# Patient Record
Sex: Male | Born: 1970 | Race: White | Hispanic: No | Marital: Single | Smoking: Former smoker
Health system: Southern US, Community
[De-identification: ages and names within clinical notes are randomized; demographics above are authoritative.]

## PROBLEM LIST (undated history)

## (undated) DIAGNOSIS — N200 Calculus of kidney: Secondary | ICD-10-CM

## (undated) DIAGNOSIS — M459 Ankylosing spondylitis of unspecified sites in spine: Secondary | ICD-10-CM

## (undated) DIAGNOSIS — G629 Polyneuropathy, unspecified: Secondary | ICD-10-CM

## (undated) HISTORY — DX: Polyneuropathy, unspecified: G62.9

## (undated) HISTORY — DX: Calculus of kidney: N20.0

## (undated) HISTORY — DX: Ankylosing spondylitis of unspecified sites in spine: M45.9

## (undated) HISTORY — PX: ANKLE SURGERY: SHX546

---

## 2007-11-08 ENCOUNTER — Encounter: Admission: RE | Admit: 2007-11-08 | Discharge: 2007-11-08 | Payer: Self-pay | Admitting: Rheumatology

## 2014-12-30 ENCOUNTER — Other Ambulatory Visit (HOSPITAL_COMMUNITY): Payer: Self-pay | Admitting: Rheumatology

## 2014-12-30 DIAGNOSIS — R1084 Generalized abdominal pain: Secondary | ICD-10-CM

## 2015-01-08 ENCOUNTER — Ambulatory Visit (HOSPITAL_COMMUNITY): Admission: RE | Admit: 2015-01-08 | Payer: Medicare Other | Source: Ambulatory Visit

## 2015-01-14 ENCOUNTER — Ambulatory Visit (HOSPITAL_COMMUNITY)
Admission: RE | Admit: 2015-01-14 | Discharge: 2015-01-14 | Disposition: A | Discharge status: Home / Self Care | Payer: Medicare Other | Source: Ambulatory Visit | Attending: Rheumatology | Admitting: Rheumatology

## 2015-01-14 DIAGNOSIS — R1906 Epigastric swelling, mass or lump: Secondary | ICD-10-CM | POA: Diagnosis not present

## 2015-01-14 DIAGNOSIS — M4320 Fusion of spine, site unspecified: Secondary | ICD-10-CM | POA: Insufficient documentation

## 2015-01-14 DIAGNOSIS — N200 Calculus of kidney: Secondary | ICD-10-CM | POA: Insufficient documentation

## 2015-01-14 DIAGNOSIS — R1084 Generalized abdominal pain: Secondary | ICD-10-CM

## 2015-01-14 DIAGNOSIS — K5730 Diverticulosis of large intestine without perforation or abscess without bleeding: Secondary | ICD-10-CM | POA: Insufficient documentation

## 2015-02-01 ENCOUNTER — Ambulatory Visit
Admission: RE | Admit: 2015-02-01 | Discharge: 2015-02-01 | Disposition: A | Discharge status: Home / Self Care | Payer: Medicare Other | Source: Ambulatory Visit | Attending: Rheumatology | Admitting: Rheumatology

## 2015-02-01 ENCOUNTER — Other Ambulatory Visit: Payer: Self-pay | Admitting: Rheumatology

## 2015-02-01 DIAGNOSIS — M542 Cervicalgia: Secondary | ICD-10-CM

## 2016-02-23 ENCOUNTER — Ambulatory Visit (INDEPENDENT_AMBULATORY_CARE_PROVIDER_SITE_OTHER): Payer: Medicare Other | Admitting: Neurology

## 2016-02-23 ENCOUNTER — Encounter: Payer: Self-pay | Admitting: Neurology

## 2016-02-23 VITALS — BP 100/70 | HR 91 | Ht 71.5 in | Wt 214.8 lb

## 2016-02-23 DIAGNOSIS — E560 Deficiency of vitamin E: Secondary | ICD-10-CM | POA: Diagnosis not present

## 2016-02-23 DIAGNOSIS — R208 Other disturbances of skin sensation: Secondary | ICD-10-CM | POA: Diagnosis not present

## 2016-02-23 DIAGNOSIS — R413 Other amnesia: Secondary | ICD-10-CM | POA: Diagnosis not present

## 2016-02-23 DIAGNOSIS — G629 Polyneuropathy, unspecified: Secondary | ICD-10-CM | POA: Diagnosis not present

## 2016-02-23 DIAGNOSIS — R269 Unspecified abnormalities of gait and mobility: Secondary | ICD-10-CM | POA: Insufficient documentation

## 2016-02-23 DIAGNOSIS — E538 Deficiency of other specified B group vitamins: Secondary | ICD-10-CM

## 2016-02-23 DIAGNOSIS — R7302 Impaired glucose tolerance (oral): Secondary | ICD-10-CM

## 2016-02-23 DIAGNOSIS — E519 Thiamine deficiency, unspecified: Secondary | ICD-10-CM | POA: Diagnosis not present

## 2016-02-23 DIAGNOSIS — E531 Pyridoxine deficiency: Secondary | ICD-10-CM

## 2016-02-23 DIAGNOSIS — R200 Anesthesia of skin: Secondary | ICD-10-CM | POA: Insufficient documentation

## 2016-02-23 NOTE — Patient Instructions (Signed)
Remember to drink plenty of fluid, eat healthy meals and do not skip any meals. Try to eat protein with a every meal and eat a healthy snack such as fruit or nuts in between meals. Try to keep a regular sleep-wake schedule and try to exercise daily, particularly in the form of walking, 20-30 minutes a day, if you can.   As far as your medications are concerned, I would like to suggest:continue current medications  As far as diagnostic testing: MRi brain, labs  I would like to see you back for emg/ncs, sooner if we need to. Please call us with any interim questions, concerns, problems, updates or refill requests.   Our phone number is [phone]. We also have an after hours call service for urgent matters and there is a physician on-call for urgent questions. For any emergencies you know to call 911 or go to the nearest emergency room

## 2016-02-23 NOTE — Progress Notes (Signed)
[facility]    Provider:  Dr Lucia Gaskins Referring Provider: Pollyann Savoy, MD Primary Care Physician:  Doreatha Martin, MD  CC:  Sensory changes  HPI:  Benjamin Wiggins is a 45 y.o. male here as a referral from Dr. Corliss Skains for sensory changes. He has a past medical history of ankylosing spondylitis and iritis. He has tingling in the joints, trouble feeling pain in some spots moreso than others. Vision changes, blurry vision and floaters. He has to wear magnifiers. He is seeing spots and floaters. He was evaluated by optho years ago for the vision and no etiology found. The sensory changes started 6 months ago and progressive. They have recently plateaued. He doesn't feel sharp, he has fogginess. He feels dull. Memory worsening. More short-term memory loss. His hands get jittery. The sensory changes started in all the fingers with decreased dexterity. Started with numbness in the fingers. Nothing makes it better of worse, he wants to itch or rub his hands but it doesn't itch on the outside. He has trouble walking. Especially i the right foot, he had plantar fasciitis. Right foot in the heel pain and gets a shooting pain starts on the bottom of the foot and goes across the bottom of the foot right side to the left side or from lateral to medial. Happens once to twice a day or 8-10 times, brief, paroxysmal. He can feel irritation from the back of the knee all the way down to the foot. Left side is fine. Hands are symmetric. He has been on and off Enbrel for years.He was evaluated by Dr. Pearlie Oyster at [facility]. He saw them in 2005, a long ago. He was having twitches 10 years ago all over the body and workup initiated without etiology. He was exposed to chemicals and toxins in the past. He denies medications below. Enbrel is the only medication he has used for AS. 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 No FHx of neuropathy.  He has back pain that radiates down the right leg.   Reviewed the following drugs with patient and I can induce polyneuropathies. He denies any of the following except he is unclear if he has been exposed to miscellaneous toxins:  Drugs that Can Induce Polyneuropathies (patient denies) Antibiotics: Chloramphenicol Sensory, optic neuropathy, Chloroquin Sensory, Dapsone Motor, Didanosine Sensory, Ethambutol Sensorimotor, Ethionamide Sensory, Isoniazid Sensory (vitamin B6 deficiency), Metronidazole Sensory, Nitrofurantoin Sensorimotor, Savudine Sensory, Suramin Suramin Sensorimotor, Zalcitabine Sensory Chemotherapeutic: Cisplatin Sensorimotor + ototoxicity, Cytarabine Sensory, Docetaxe Sensorimotor, Paclitaxel Sensorimotor, Procarbazine Sensorimotor, Vinblastine Sensorimotor, Vincristine Sensorimotor,  Cardiovascular: Amiodarone Sensorimotor +ototoxicity, Captopril Sensorimotor Enalapril Sensorimotor, Flecainide Sensory, Hydralazine Sensory (vitamin B6 deficiency), Perhexiline Sensorimotor Rheumatologic: Allopurinol Sensorimotor,Colchicine, Sensory, Gold Sensorimotor, Indomethacin Sensorimotor Miscellaneous: (He is unclear if he has been exposed to toxins 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)): Disulfiram Sensory, Interferon alfa Sensorimotor, Lithium Sensorimotor, Lovastatin Sensorimotor, Phenytoin Sensorimotor, Pyridoxine Sensory, Simvastatin Sensorimotor, Thalidomide Sensorimotor, crylamide Sensorimotor + ataxia, Allyl chloride Sensory, Arsenic Sensorimotor, Carbon disulfide Sensorimotor, Ethylene oxide Sensorimotor + ataxia, Hexacarbons Sensorimotor, Lead _ Mercury Sensorimotor(motor > sensory), Organohosphorus esters Sensorimotor + autonomic (cholinergic), Thallium Sensorimotor, Trichloroethylene Cranial neuropathies  Reviewed notes, labs and imaging from outside physicians, which showed: Patient has a history of ankylosing spondylitis.  Reviewed notes from [facility] rheumatology. He takes Enbrel as it is available but has a difficult time due to cost of the drug. His lower back feels better since he was  in the Enbrel. He was seen on 02/03/2016 and concerned about progressively feeling sensation and warmth in his upper and lower extremities. He does not feel touch is prominent anymore. Felt as though he was losing sensation and experiencing Jeanella Crazeierce easiest. Symptoms more prominent on the right than on the left. Workup for MS in the past in [place] and he was not satisfied with workup. Recent MRI of the brain and cervical spine were reportedly normal. Patient has depression and is seeing a psychologist.   XR c-spine 01/2015 personally reviewed images and agree with the following:Vertebral body height and alignment are maintained. No fusion across the disc interspaces or facet joints is identified. Intervertebral disc space height is normal. No pathologic motion is seen with flexion or extension although range of motion is mildly limited.Facet degenerative change is seen at C7-T1.  IMPRESSION: No plain film evidence of ankylosing spondylitis of the cervical spine. Negative for pathologic motion.  Facet arthropathy C7-T1.  Review of Systems: Patient complains of symptoms per HPI as well as the following symptoms: Weight gain, blurred vision, loss of vision, shortness of breath, feeling hot, feeling cold, ringing in ears, joint pain, joint swelling, allergies, frequent infections, confusion, numbness, dizziness, restless legs, depression, decreased energy, disinterest in activities. Pertinent negatives per HPI. All others negative.   Social History   Social History  . Marital Status: Single    Spouse Name: [name]  . Number of Children: 1  . Years of Education: 14   Occupational History  . Unemployed- Disability     Social History Main Topics  . Smoking status: Former Games developermoker  . Smokeless tobacco: Not on file  . Alcohol Use: No   . Drug Use: No  . Sexual Activity: Not on file   Other Topics Concern  . Not on file   Social History Narrative   Lives with [name]    Caffeine use: 3-4 cups per day    Family History  Problem Relation Age of Onset  . Diabetes Father   . Neuropathy Neg Hx     Past Medical History  Diagnosis Date  . Ankylosing spondylitis (HCC)   . Neuropathy [facility](HCC)     Past Surgical History  Procedure Laterality Date  . Ankle surgery      titatium plate    Current Outpatient Prescriptions  Medication Sig Dispense Refill  . albuterol (PROAIR HFA) 108 (90 Base) MCG/ACT inhaler Inhale 2 puffs into the lungs every 6 (six) hours as needed.    . fluticasone (FLONASE) 50 MCG/ACT nasal spray Place 1 spray into both nostrils daily.    Marland Kitchen. gabapentin (NEURONTIN) 300 MG capsule Take 300 mg by mouth 3 (three) times daily.    Marland Kitchen. loratadine (CLARITIN) 10 MG tablet Take 10 mg by mouth daily.    . Naproxen Sodium (ALEVE PO) Take 4 tablets by mouth daily as needed.    Marland Kitchen. oxycodone (OXY-IR) 5 MG capsule Take 5 mg by mouth 2 (two) times daily as needed.    . sertraline (ZOLOFT) 50 MG tablet Take 50 mg by mouth daily.     No current facility-administered medications for this visit.    Allergies as of 02/23/2016 - Review Complete 02/23/2016  Allergen Reaction Noted  . Iohexol Shortness Of Breath 01/08/2015  . Iodinated diagnostic agents Other (See Comments) 02/23/2016    Vitals: BP 100/70 mmHg  Pulse 91  Ht 5' 11.5" (1.816 m)  Wt 214 lb 12.8 oz (97.433 kg)  BMI 29.54 kg/m2  SpO2 97%  Last Weight:  Wt Readings from Last 1 Encounters:  02/23/16 214 lb 12.8 oz (97.433 kg)   Last Height:   Ht Readings from Last 1 Encounters:  02/23/16 5' 11.5" (1.816 m)    Physical exam: Exam: Gen: NAD, conversant, well nourised, well groomed                     CV: RRR, no MRG. No Carotid Bruits. No peripheral edema, warm, nontender Eyes: Conjunctivae clear without exudates or  hemorrhage  Neuro: Detailed Neurologic Exam  Speech:    Speech is normal; fluent and spontaneous with normal comprehension.  Cognition:    The patient is oriented to person, place, and time;     recent and remote memory intact;     language fluent;     normal attention, concentration,     fund of knowledge Cranial Nerves:    The pupils are equal, round, and reactive to light. The fundi are normal and spontaneous venous pulsations are present. Visual fields are full to finger confrontation. Extraocular movements are intact. Trigeminal sensation is intact and the muscles of mastication are normal. The face is symmetric. The palate elevates in the midline. Hearing intact. Voice is normal. Shoulder shrug is normal. The tongue has normal motion without fasciculations.   Coordination:    Normal finger to nose and heel to shin. Normal rapid alternating movements.   Gait:    Heel-toe and tandem gait are normal.   Motor Observation:    No asymmetry, no atrophy, and no involuntary movements noted. Tone:    Normal muscle tone.    Posture:    Posture is normal. normal erect    Strength: Decease distally in pin prick and temperatire in the LE, unable to feel pin and temp in the uppers even above the elbows. Intact vibration and proprioception.       Strength is V/V in the upper and lower limbs.      Sensation: intact to LT     Reflex Exam:  DTR's: Absent left AJ otherwise deep tendon reflexes in the upper and lower extremities are normal bilaterally.   Toes:    The toes are downgoing bilaterally.   Clonus:    Clonus is absent.     Assessment/Plan:   45 y.o. male here as a referral from Dr. Corliss Skains for sensory changes for 6 months that started with numbness in the fingers. He has a past medical history of ankylosing spondylitis and iritis. He has tingling in the joints, trouble feeling pain in some spots, shooting pain in the right heel of the foot, back pain with radiation down  the right leg. symptoms in the fingers are symmetrical bilaterally, and his left leg is unaffected.  Right leg and right upper NCS and emg. MRI of the brain with and without contrast(patient denies recent MRI of the brain) We will perform an extensive serum neuropathy screen.  CC: Dr. Titus Dubin and Dr. Elisha Headland, MD  [facility] [address] [place], [place] [address]  Phone [phone] Fax [phone]

## 2016-02-26 ENCOUNTER — Encounter: Payer: Self-pay | Admitting: Neurology

## 2016-02-26 LAB — HEPATITIS C ANTIBODY: HCV Ab: 0.1 s/co ratio (ref 0.0–0.9)

## 2016-02-29 LAB — COMPREHENSIVE METABOLIC PANEL
ALK PHOS: 105 IU/L (ref 39–117)
ALT: 33 IU/L (ref 0–44)
AST: 16 IU/L (ref 0–40)
Albumin/Globulin Ratio: 1.6 (ref 1.2–2.2)
Albumin: 4.5 g/dL (ref 3.5–5.5)
BILIRUBIN TOTAL: 0.4 mg/dL (ref 0.0–1.2)
BUN/Creatinine Ratio: 15 (ref 9–20)
BUN: 13 mg/dL (ref 6–24)
CHLORIDE: 100 mmol/L (ref 96–106)
CO2: 20 mmol/L (ref 18–29)
Calcium: 9.5 mg/dL (ref 8.7–10.2)
Creatinine, Ser: 0.86 mg/dL (ref 0.76–1.27)
GFR calc Af Amer: 121 mL/min/{1.73_m2} (ref >59)
GFR calc non Af Amer: 105 mL/min/{1.73_m2} (ref >59)
GLUCOSE: 99 mg/dL (ref 65–99)
Globulin, Total: 2.8 g/dL (ref 1.5–4.5)
Potassium: 4.5 mmol/L (ref 3.5–5.2)
Sodium: 138 mmol/L (ref 134–144)
TOTAL PROTEIN: 7.3 g/dL (ref 6.0–8.5)

## 2016-02-29 LAB — CBC
HEMATOCRIT: 41.4 % (ref 37.5–51.0)
HEMOGLOBIN: 14.6 g/dL (ref 12.6–17.7)
MCH: 32.6 pg (ref 26.6–33.0)
MCHC: 35.3 g/dL (ref 31.5–35.7)
MCV: 92 fL (ref 79–97)
Platelets: 250 10*3/uL (ref 150–379)
RBC: 4.48 x10E6/uL (ref 4.14–5.80)
RDW: 12.9 % (ref 12.3–15.4)
WBC: 7.4 10*3/uL (ref 3.4–10.8)

## 2016-02-29 LAB — MULTIPLE MYELOMA PANEL, SERUM
ALBUMIN SERPL ELPH-MCNC: 3.7 g/dL (ref 2.9–4.4)
ALPHA 1: 0.2 g/dL (ref 0.0–0.4)
Albumin/Glob SerPl: 1.1 (ref 0.7–1.7)
Alpha2 Glob SerPl Elph-Mcnc: 0.8 g/dL (ref 0.4–1.0)
B-Globulin SerPl Elph-Mcnc: 1.2 g/dL (ref 0.7–1.3)
GAMMA GLOB SERPL ELPH-MCNC: 1.4 g/dL (ref 0.4–1.8)
Globulin, Total: 3.6 g/dL (ref 2.2–3.9)
IGG (IMMUNOGLOBIN G), SERUM: 1188 mg/dL (ref 700–1600)
IgA/Immunoglobulin A, Serum: 259 mg/dL (ref 90–386)
IgM (Immunoglobulin M), Srm: 152 mg/dL (ref 20–172)

## 2016-02-29 LAB — PAN-ANCA
Atypical pANCA: <1:20 {titer}
C-ANCA: <1:20 {titer}
P-ANCA: <1:20 {titer}

## 2016-02-29 LAB — SJOGREN'S SYNDROME ANTIBODS(SSA + SSB): ENA SSA (RO) Ab: <0.2 AI (ref 0.0–0.9)

## 2016-02-29 LAB — B12 AND FOLATE PANEL
FOLATE: 10.1 ng/mL (ref >3.0)
VITAMIN B 12: 341 pg/mL (ref 211–946)

## 2016-02-29 LAB — HEAVY METALS, BLOOD
ARSENIC: 6 ug/L (ref 2–23)
LEAD, BLOOD: 1 ug/dL (ref 0–19)
Mercury: NOT DETECTED ug/L (ref 0.0–14.9)

## 2016-02-29 LAB — HCV AB W REFLEX TO QUANT PCR: HCV Ab: 0.1 s/co ratio (ref 0.0–0.9)

## 2016-02-29 LAB — ANGIOTENSIN CONVERTING ENZYME: Angio Convert Enzyme: 41 U/L (ref 14–82)

## 2016-02-29 LAB — RPR: RPR: NONREACTIVE

## 2016-02-29 LAB — VITAMIN B1: THIAMINE: 146.4 nmol/L (ref 66.5–200.0)

## 2016-02-29 LAB — TSH: TSH: 1.93 u[IU]/mL (ref 0.450–4.500)

## 2016-02-29 LAB — SEDIMENTATION RATE: Sed Rate: 22 mm/hr — ABNORMAL HIGH (ref 0–15)

## 2016-02-29 LAB — VITAMIN B6: VITAMIN B6: 4.7 ug/L — AB (ref 5.3–46.7)

## 2016-02-29 LAB — METHYLMALONIC ACID, SERUM: METHYLMALONIC ACID: 187 nmol/L (ref 0–378)

## 2016-02-29 LAB — HIV ANTIBODY (ROUTINE TESTING W REFLEX): HIV Screen 4th Generation wRfx: NONREACTIVE

## 2016-02-29 LAB — HEMOGLOBIN A1C
ESTIMATED AVERAGE GLUCOSE: 117 mg/dL
Hgb A1c MFr Bld: 5.7 % — ABNORMAL HIGH (ref 4.8–5.6)

## 2016-02-29 LAB — B. BURGDORFI ANTIBODIES: Lyme IgG/IgM Ab: <0.91 {ISR} (ref 0.00–0.90)

## 2016-02-29 LAB — VITAMIN E: Vitamin E (Alpha Tocopherol): 13.1 mg/L (ref 5.3–17.5)

## 2016-02-29 LAB — RHEUMATOID FACTOR: Rhuematoid fact SerPl-aCnc: <10 IU/mL (ref 0.0–13.9)

## 2016-03-02 ENCOUNTER — Ambulatory Visit
Admission: RE | Admit: 2016-03-02 | Discharge: 2016-03-02 | Disposition: A | Discharge status: Home / Self Care | Payer: Medicare Other | Source: Ambulatory Visit | Attending: Neurology | Admitting: Neurology

## 2016-03-02 DIAGNOSIS — R200 Anesthesia of skin: Secondary | ICD-10-CM | POA: Diagnosis not present

## 2016-03-02 DIAGNOSIS — G629 Polyneuropathy, unspecified: Secondary | ICD-10-CM

## 2016-03-02 DIAGNOSIS — R413 Other amnesia: Secondary | ICD-10-CM

## 2016-03-02 DIAGNOSIS — R269 Unspecified abnormalities of gait and mobility: Secondary | ICD-10-CM

## 2016-03-02 MED ORDER — GADOBENATE DIMEGLUMINE 529 MG/ML IV SOLN
20.0000 mL | Freq: Once | INTRAVENOUS | Status: AC | PRN
  Administered 2016-03-02: 20 mL via INTRAVENOUS

## 2016-03-03 ENCOUNTER — Telehealth: Payer: Self-pay | Admitting: *Deleted

## 2016-03-03 ENCOUNTER — Encounter: Payer: Medicare Other | Admitting: Neurology

## 2016-03-03 NOTE — Telephone Encounter (Signed)
Called and spoke to pt about lab results per Dr Lucia GaskinsAhern note. He will start to take daily multivitamin w/ B6. Added to pt med list. He had MRI done yesterday. He stated his insurance will cover EMG/NCS study if MRI does not reveal anything. He will then proceed with that test. Advised we will call him with results once they come back. He verbalized understanding.

## 2016-03-03 NOTE — Telephone Encounter (Signed)
-----   Message from Anson FretAntonia B Ahern, MD sent at 02/28/2016  7:01 PM EDT ----- His B6 was low(normal range 5.3-46 and his B6 was 4.7), he should take a multivitamin daily with B6 in it. B6 deficiency can cause neuropathy.  Also eat things with B6 in them, Many fruits, vegetables, and whole grains are good sources of vitamin B6. Some ready-to-eat breakfast cereals are fortified with vitamin B6.  Fish, beef, and Malaw[place]urkey contain high amounts of vitamin B6. Beans and nuts are also sources of vitamin B6. If he takes a B6 supplement do not take more than 100mg  a day.  The average vitamin B6 intake is about  2 mg/day in men.  His HgbA1c was 5.7, this is technically pre-diabetes. Recommend watching diet and exercise. Otehrwise labs unremarkable. Thanks.

## 2016-03-06 ENCOUNTER — Telehealth: Payer: Self-pay | Admitting: *Deleted

## 2016-03-06 NOTE — Telephone Encounter (Signed)
Called pt about MRI results per Dr Lucia GaskinsAhern note. He verbalized understanding. He is going to call insurance company again and see if they will cover EMG/NCS now. He will call back to let us know.

## 2016-03-06 NOTE — Telephone Encounter (Signed)
-----   Message from Anson FretAntonia B Ahern, MD sent at 03/04/2016  6:01 PM EDT ----- MRI of thr brain unremarkable without any changes as compared to 2008. thanks

## 2016-03-07 ENCOUNTER — Encounter: Payer: Self-pay | Admitting: *Deleted

## 2016-03-07 NOTE — Progress Notes (Signed)
Faxed completed form by Dr Lucia GaskinsAhern back to [facility] RE: Enbrel contraindication. Fax: [phone]. Received confirmation. Sent copy to MR.

## 2016-03-21 ENCOUNTER — Telehealth: Payer: Self-pay | Admitting: Neurology

## 2016-03-21 NOTE — Telephone Encounter (Signed)
Pt called and says that [facility] is telling him they did not receive any documenttaion about pt taking Enbrel . May call pt [phone]

## 2016-03-21 NOTE — Telephone Encounter (Signed)
Called pt back. I advised we sent completed form back to [facility] on 03/07/16 and received fax confirmation. He stated he spoke to Amy on 4/7 and they had not received anything and was going to try and send another attempt. Advised I did not receive this request. I will be happy to resend completed form per his request. He verbalized understanding and appreciation.   Re-faxed completed form to [phone]. Received confirmation.

## 2016-05-17 ENCOUNTER — Other Ambulatory Visit: Payer: Self-pay | Admitting: Orthopaedic Surgery

## 2016-05-17 DIAGNOSIS — S82122B Displaced fracture of lateral condyle of left tibia, initial encounter for open fracture type I or II: Secondary | ICD-10-CM

## 2016-05-26 ENCOUNTER — Ambulatory Visit
Admission: RE | Admit: 2016-05-26 | Discharge: 2016-05-26 | Disposition: A | Discharge status: Home / Self Care | Payer: Medicare Other | Source: Ambulatory Visit | Attending: Orthopaedic Surgery | Admitting: Orthopaedic Surgery

## 2016-05-26 DIAGNOSIS — S82122B Displaced fracture of lateral condyle of left tibia, initial encounter for open fracture type I or II: Secondary | ICD-10-CM

## 2016-10-02 NOTE — Progress Notes (Signed)
This CT abdomen without contrast was already addressed.  Please see SRS for full details. Look at attached notes associated with the CT report dated FEBRUARY 20 05/24/2015  Here is some of what I wrote: also, must have a family doctor. We are unable to interpret the CT Scan. What is the family doctor's name where we can send the CT ABDOMEN report. 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 And pt has diverticulosis that needs to be seen by PCP.  We cannot give pain meds to pt. It needs to be done by PCP.

## 2016-10-12 IMAGING — CT CT KNEE*L* W/O CM
3 of 4 series · 15 of 33 positions shown, 17 images · non-contrast
Comparison: None.

CLINICAL DATA: Nonspecific (abnormal) findings on radiological and
other examination of musculoskeletal system. Left tibial plateau
fracture.

EXAM:
CT OF THE LEFT KNEE WITHOUT CONTRAST
3-DIMENSIONAL CT IMAGE RENDERING ON INDEPENDENT WORKSTATION
TECHNIQUE: Multidetector CT imaging of the left knee was performed according to
the standard protocol. Multiplanar CT image reconstructions were
also generated.
3-dimensional CT images were rendered by post-processing of the
original CT data on an independent workstation. The 3-dimensional CT
images were interpreted and findings were reported in the
accompanying complete CT report for this study

[Series 4: knee soft tissue · axial · 0.36mm/px · z∈[+470,+620]mm · 8 of 60 slices shown, 10 images]
[im 5/60  soft-tissue]
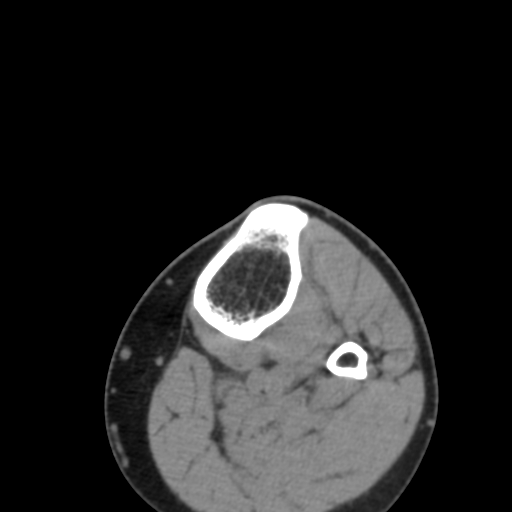
[im 5/60  bone]
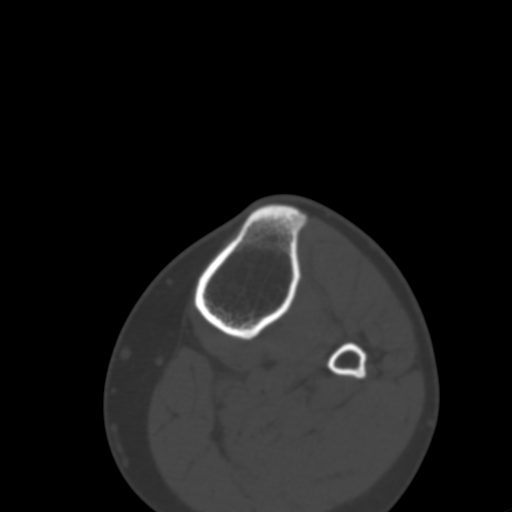
[im 14/60  bone]
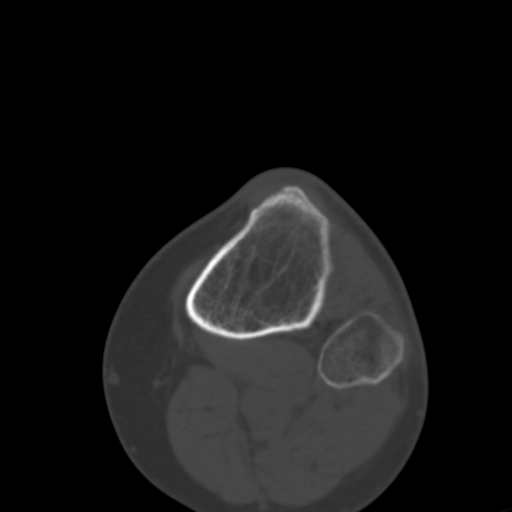
[im 19/60  bone]
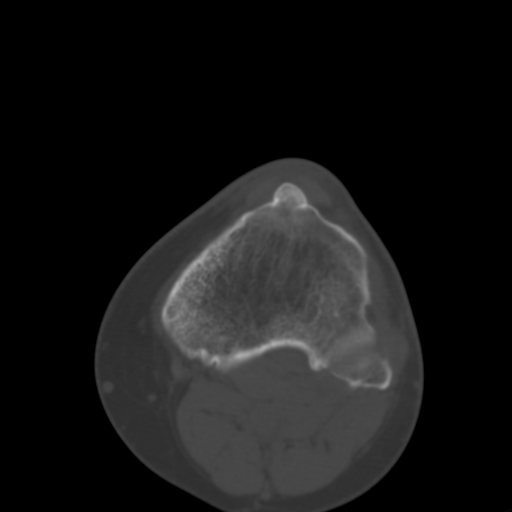
[im 28/60  bone]
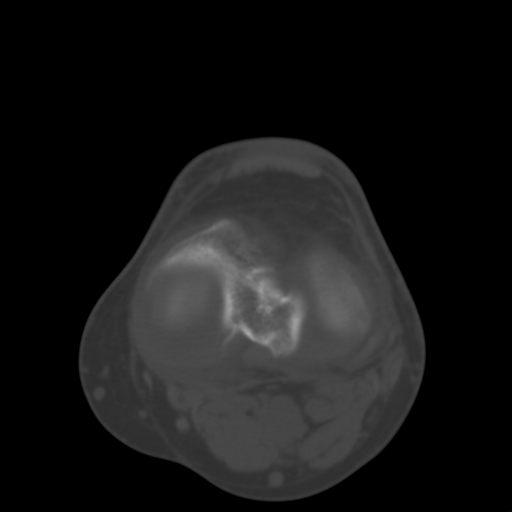
[im 32/60  soft-tissue]
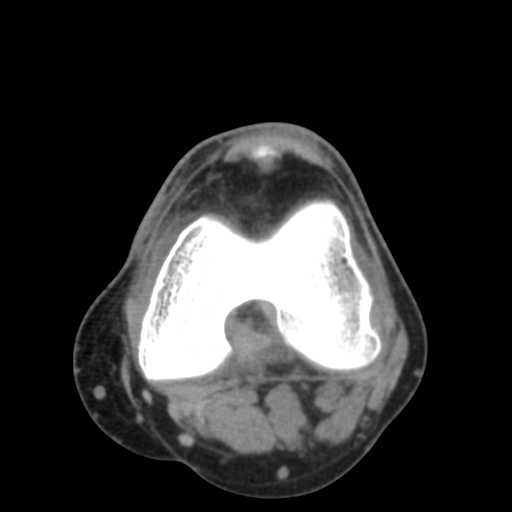
[im 32/60  bone]
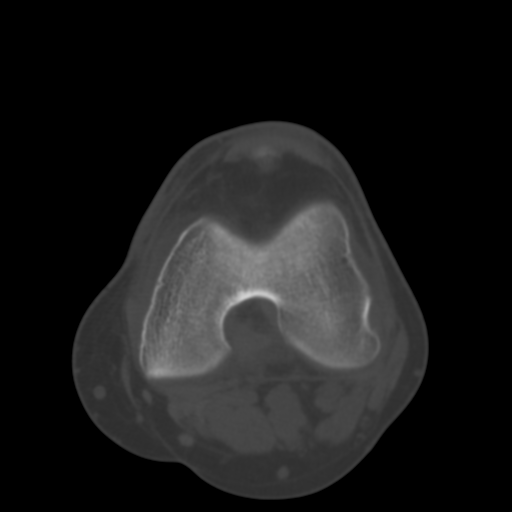
[im 41/60  bone]
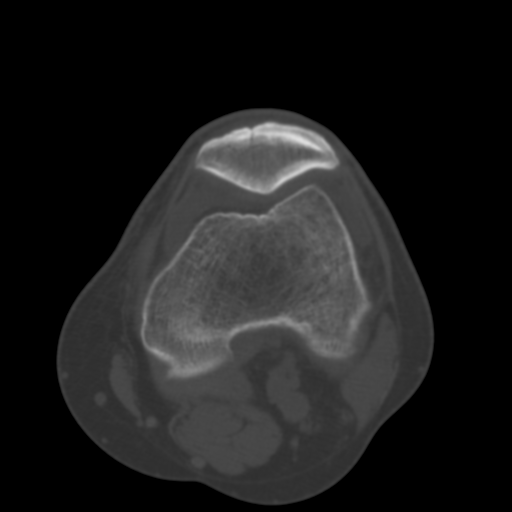
[im 46/60  bone]
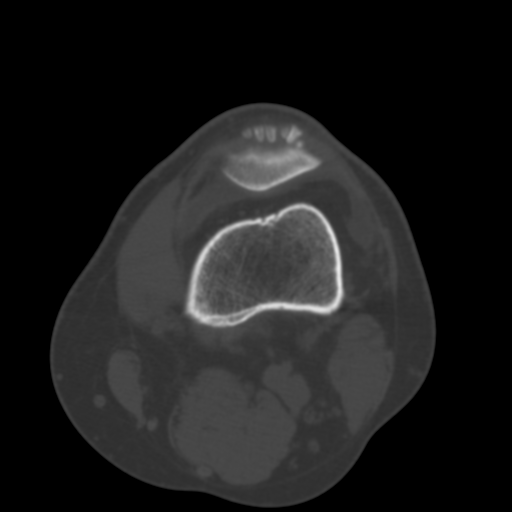
[im 55/60  bone]
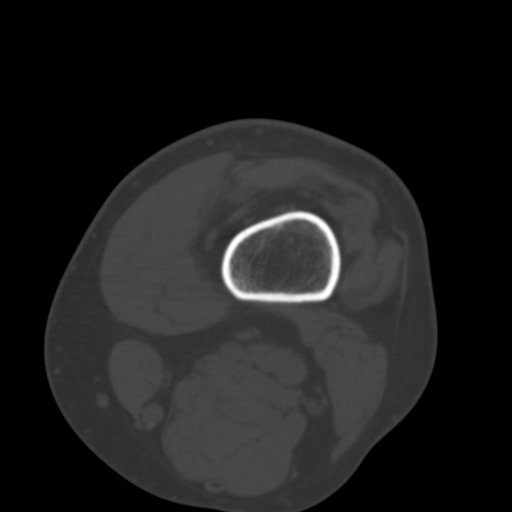

[Series 8: cor soft · coronal · 0.25mm/px · 3 of 48 slices shown]
[im 10/48  bone]
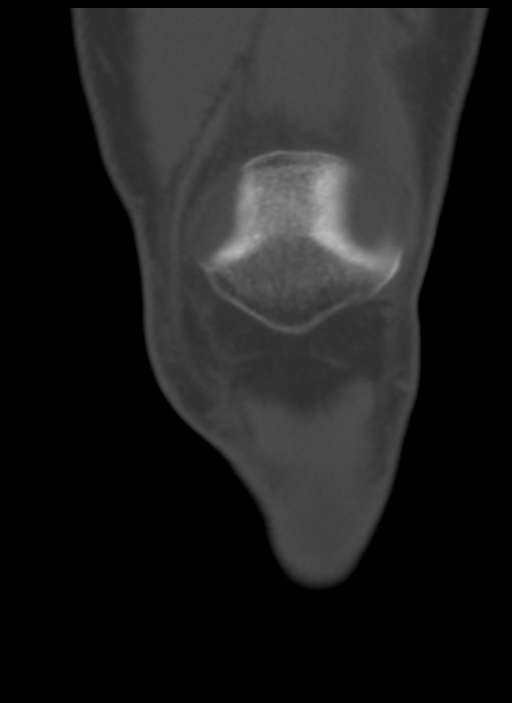
[im 19/48  bone]
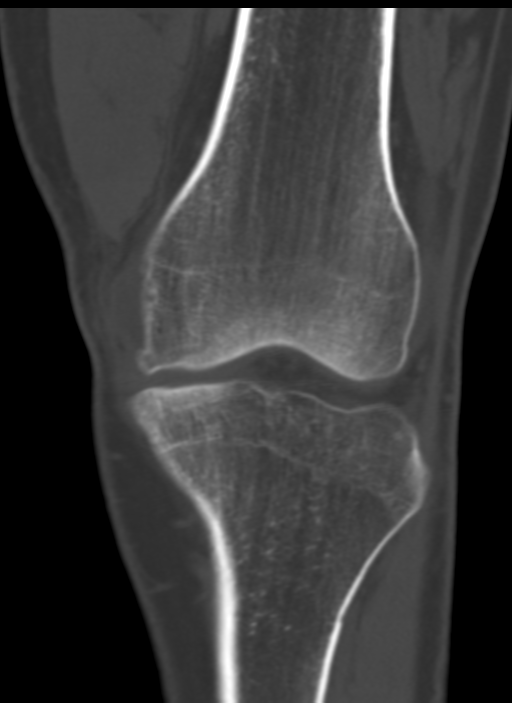
[im 29/48  bone]
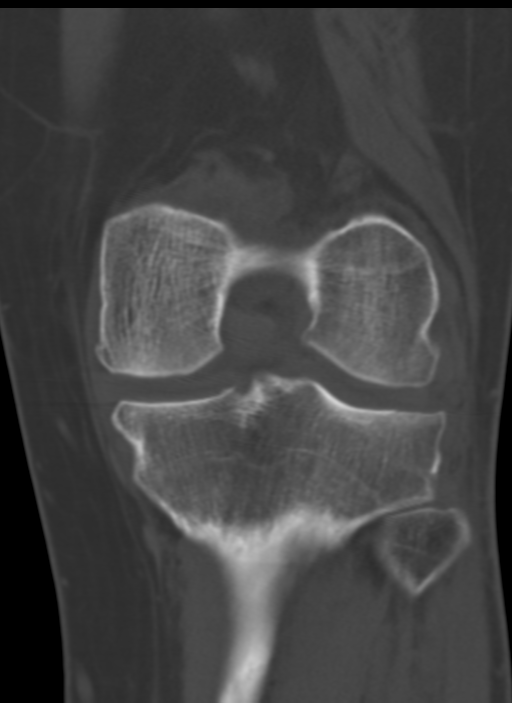

[Series 10: sag bone · sagittal · 0.29mm/px · 4 of 43 slices shown]
[im 9/43  bone]
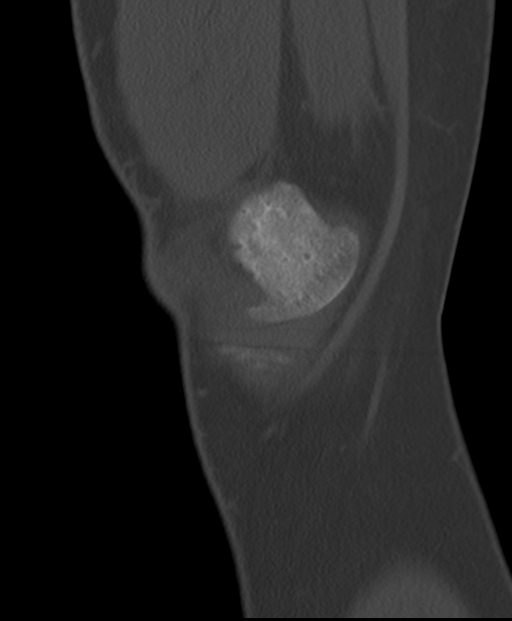
[im 17/43  bone]
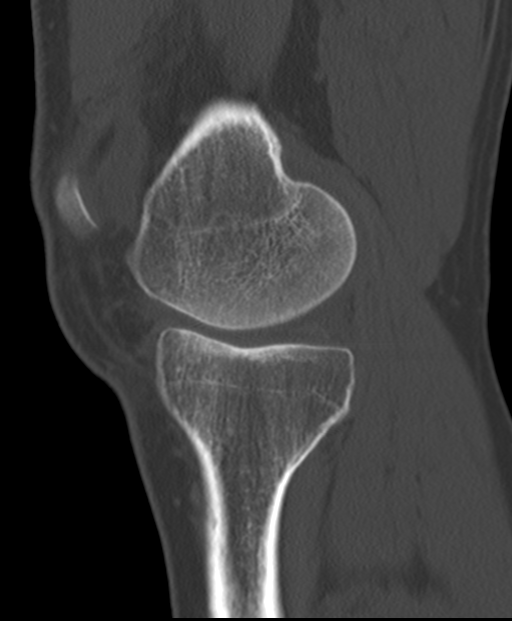
[im 26/43  bone]
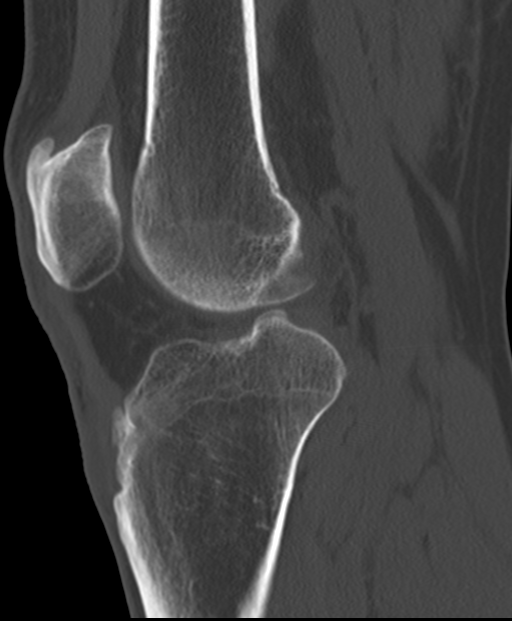
[im 34/43  bone]
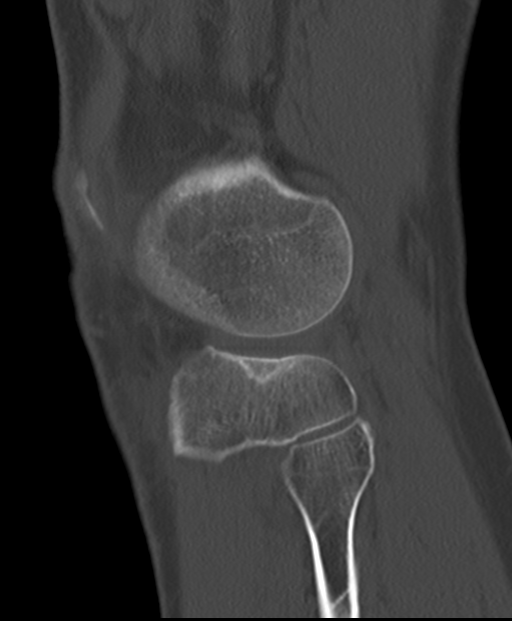

[15 of 33 positions shown; findings below may reference images not displayed]

FINDINGS: Bones/Joint/Cartilage

There is a nondisplaced mildly impacted left lateral tibial plateau
fracture involving the articular surface with approximately 1 mm of
depression. The fracture does not extend into the metaphysis.

No other fracture or dislocation. No aggressive lytic or sclerotic
osseous lesion. Minimal tricompartmental osteoarthritic changes.
Normal alignment. Small knee joint effusion.

Tendons
Intact quadriceps and patellar tendons.

Muscles

Normal.

Soft tissue
No fluid collection or hematoma.  No soft tissue mass.
IMPRESSION: 1. Nondisplaced, mildly impacted left lateral tibial plateau
fracture.

## 2016-11-03 ENCOUNTER — Telehealth: Payer: Self-pay | Admitting: Radiology

## 2016-11-03 DIAGNOSIS — Z79899 Other long term (current) drug therapy: Secondary | ICD-10-CM

## 2016-11-03 NOTE — Telephone Encounter (Signed)
No labs since July unable to refill Lab orders put in Epic  TB gold not due until July

## 2016-11-03 NOTE — Telephone Encounter (Signed)
Refill request received via fax for Enbrel, [place]

## 2016-11-07 DIAGNOSIS — Z79899 Other long term (current) drug therapy: Secondary | ICD-10-CM | POA: Insufficient documentation

## 2016-11-07 DIAGNOSIS — M459 Ankylosing spondylitis of unspecified sites in spine: Secondary | ICD-10-CM | POA: Insufficient documentation

## 2016-11-07 NOTE — Progress Notes (Signed)
Office Visit Note  Patient: Benjamin Wiggins             Date of Birth: 1971/01/11           MRN: [id number]             PCP: Doreatha MartinVELAZQUEZ,GRETCHEN, MD Referring: Cheron SchaumannVelazquez, Gretchen Y.,* Visit Date: 11/09/2016 Occupation: Disability    Subjective:  Left shoulder pain   History of Present Illness: Benjamin HarvestFrancis J Xin is a 45 y.o. male with history of ankylosing spondylitis. He states she's been taking an Brohl on regular basis since the last visit he has noticed improvement in his lower back pain. He had a fall in January where he fractured part of his knee and reports that he was in a knee brace for a while and then crutches. While using the crutches he started having pain in his left wrist and his left shoulder. His left shoulder joint is still continues to hurt and he has discomfort with range of motion. He states any activity causes increased pain to his left shoulder.  He also has noticed occasional bleeding per rectum with occasional diarrhea he had about 4 episodes and then it resolved he had recurrence one more time but now the symptoms have resolved. He is not taking any anti-inflammatories now. He had a colonoscopy about 5 years ago by Dr. Elnoria HowardHung.  Activities of Daily Living:  Patient reports morning stiffness for 1 hour.   Patient Reports nocturnal pain.  Difficulty dressing/grooming: Reports Difficulty climbing stairs: Reports Difficulty getting out of chair: Denies Difficulty using hands for taps, buttons, cutlery, and/or writing: Denies   Review of Systems  Constitutional: Positive for fatigue. Negative for night sweats and weakness ( ).  HENT: Negative for mouth sores, mouth dryness and nose dryness.   Eyes: Negative for redness and dryness.  Respiratory: Negative for shortness of breath and difficulty breathing.   Cardiovascular: Negative for chest pain, palpitations, hypertension, irregular heartbeat and swelling in legs/feet.  Gastrointestinal: Positive for blood in  stool and diarrhea. Negative for constipation.  Endocrine: Negative for increased urination.  Musculoskeletal: Positive for arthralgias, joint pain and morning stiffness. Negative for joint swelling, myalgias, muscle weakness, muscle tenderness and myalgias.  Skin: Negative for color change, rash, hair loss, nodules/bumps, skin tightness, ulcers and sensitivity to sunlight.  Allergic/Immunologic: Negative for susceptible to infections.  Neurological: Negative for dizziness, fainting, memory loss and night sweats.  Hematological: Negative for swollen glands.  Psychiatric/Behavioral: Positive for depressed mood and sleep disturbance. The patient is nervous/anxious.     PMFS History:  Patient Active Problem List   Diagnosis Date Noted  . Ankylosing spondylitis (HCC) 11/07/2016  . High risk medication use 11/07/2016  . Neuropathy (HCC) 02/23/2016  . Numbness 02/23/2016  . Facial numbness 02/23/2016  . Gait disorder 02/23/2016  . Memory changes 02/23/2016    Past Medical History:  Diagnosis Date  . Ankylosing spondylitis (HCC)   . Neuropathy (HCC)     Family History  Problem Relation Age of Onset  . Diabetes Father   . Neuropathy Neg Hx    Past Surgical History:  Procedure Laterality Date  . ANKLE SURGERY     titatium plate   Social History   Social History Narrative   Lives with Mauri Brooklynenise Smith    Caffeine use: 3-4 cups per day     Objective: Vital Signs: BP 132/70   Pulse 80   Resp 18   Ht 6' (1.829 m)   Wt 218 lb (98.9  kg)   BMI 29.57 kg/m    Physical Exam   Musculoskeletal Exam:   CDAI Exam: CDAI Homunculus Exam:   Joint Counts:  CDAI Tender Joint count: 0 CDAI Swollen Joint count: 0  Global Assessments:  Patient Global Assessment: 7 Provider Global Assessment: 5  CDAI Calculated Score: 12    Investigation: Findings:  06/25/2016 CBC CMP and TB gold normal / negative  11/12/2007 screening hepatitis panel negative     Imaging: No results  found.  Speciality Comments: No specialty comments available.    Procedures:  Large Joint Inj Date/Time: 11/09/2016 10:59 AM Performed by: Pollyann SavoyEVESHWAR, Keimani Laufer Authorized by: Pollyann SavoyEVESHWAR, Jayliana Valencia   Consent Given by:  Patient Site marked: the procedure site was marked   Timeout: prior to procedure the correct patient, procedure, and site was verified   Indications:  Pain Location:  Shoulder Site:  L glenohumeral Prep: patient was prepped and draped in usual sterile fashion   Needle Size:  27 G Needle Length:  1.5 inches Approach:  Posterior Ultrasound Guidance: No   Fluoroscopic Guidance: No   Arthrogram: No   Medications:  1 mL lidocaine 1 %; 40 mg triamcinolone acetonide 40 MG/ML Aspiration Attempted: Yes   Aspirate amount (mL):  0 Patient tolerance:  Patient tolerated the procedure well with no immediate complications   Allergies: Iohexol and Iodinated diagnostic agents   Assessment / Plan:     Visit Diagnoses: Ankylosing spondylitis of multiple sites in spine : He is been taking Enbrel on regular basis now and his lower back pain has improved. He's been having some discomfort in his left shoulder joint him since he is use crutches and having difficulty raising his arm and getting dressed. After informed consent was obtained be decided to proceed with the cortisone injection as described above.  High risk medication use - Enbrel every week -we'll check labs today and then every 3 months to monitor for drug toxicity Plan: CBC with Differential/Platelet, COMPLETE METABOLIC PANEL WITH GFR, CBC with Differential/Platelet, COMPLETE METABOLIC PANEL WITH GFR  Tendinopathy of left shoulder: He tolerates the cortisone injection well. We'll see response to that for right now.  Chronic bilateral low back pain without sciatica: Improved advised to continue with the range of motion exercises  Chronic pain of left knee handout on knee exercises was given.  Neuropathy [facility](HCC): He is on  Neurontin for that.    Orders: Orders Placed This Encounter  Procedures  . Large Joint Injection/Arthrocentesis  . CBC with Differential/Platelet  . COMPLETE METABOLIC PANEL WITH GFR  . CBC with Differential/Platelet  . COMPLETE METABOLIC PANEL WITH GFR   No orders of the defined types were placed in this encounter.   Face-to-face time spent with patient was 30 minutes. 50% of time was spent in counseling and coordination of care.  Follow-Up Instructions: Return in about 5 months (around 04/09/2017) for Ankylosing spondylitis.   Pollyann SavoyShaili Quandre Polinski, MD

## 2016-11-08 NOTE — Telephone Encounter (Signed)
Patient is coming in for an appointment tomorrow 11/09/16. Will have labs at that appointment.

## 2016-11-09 ENCOUNTER — Encounter: Payer: Self-pay | Admitting: Rheumatology

## 2016-11-09 ENCOUNTER — Ambulatory Visit (INDEPENDENT_AMBULATORY_CARE_PROVIDER_SITE_OTHER): Payer: Medicare Other | Admitting: Rheumatology

## 2016-11-09 ENCOUNTER — Ambulatory Visit: Payer: Medicare Other | Admitting: Rheumatology

## 2016-11-09 VITALS — BP 132/70 | HR 80 | Resp 18 | Ht 72.0 in | Wt 218.0 lb

## 2016-11-09 DIAGNOSIS — Z79899 Other long term (current) drug therapy: Secondary | ICD-10-CM

## 2016-11-09 DIAGNOSIS — M450 Ankylosing spondylitis of multiple sites in spine: Secondary | ICD-10-CM

## 2016-11-09 DIAGNOSIS — M545 Low back pain: Secondary | ICD-10-CM

## 2016-11-09 DIAGNOSIS — M25562 Pain in left knee: Secondary | ICD-10-CM | POA: Diagnosis not present

## 2016-11-09 DIAGNOSIS — G629 Polyneuropathy, unspecified: Secondary | ICD-10-CM

## 2016-11-09 DIAGNOSIS — M67912 Unspecified disorder of synovium and tendon, left shoulder: Secondary | ICD-10-CM

## 2016-11-09 DIAGNOSIS — G8929 Other chronic pain: Secondary | ICD-10-CM

## 2016-11-09 LAB — COMPLETE METABOLIC PANEL WITH GFR
ALT: 44 U/L (ref 9–46)
AST: 24 U/L (ref 10–40)
Albumin: 4.4 g/dL (ref 3.6–5.1)
Alkaline Phosphatase: 90 U/L (ref 40–115)
BUN: 13 mg/dL (ref 7–25)
CHLORIDE: 104 mmol/L (ref 98–110)
CO2: 24 mmol/L (ref 20–31)
Calcium: 9.4 mg/dL (ref 8.6–10.3)
Creat: 0.83 mg/dL (ref 0.60–1.35)
Glucose, Bld: 102 mg/dL — ABNORMAL HIGH (ref 65–99)
POTASSIUM: 4.3 mmol/L (ref 3.5–5.3)
SODIUM: 138 mmol/L (ref 135–146)
Total Bilirubin: 0.6 mg/dL (ref 0.2–1.2)
Total Protein: 7.2 g/dL (ref 6.1–8.1)

## 2016-11-09 LAB — CBC WITH DIFFERENTIAL/PLATELET
BASOS PCT: 0 %
Basophils Absolute: 0 cells/uL (ref 0–200)
Eosinophils Absolute: 189 cells/uL (ref 15–500)
Eosinophils Relative: 3 %
HCT: 43.5 % (ref 38.5–50.0)
Hemoglobin: 14.6 g/dL (ref 13.2–17.1)
LYMPHS PCT: 26 %
Lymphs Abs: 1638 cells/uL (ref 850–3900)
MCH: 32.2 pg (ref 27.0–33.0)
MCHC: 33.6 g/dL (ref 32.0–36.0)
MCV: 96 fL (ref 80.0–100.0)
MONOS PCT: 9 %
MPV: 9.8 fL (ref 7.5–12.5)
Monocytes Absolute: 567 cells/uL (ref 200–950)
NEUTROS ABS: 3906 {cells}/uL (ref 1500–7800)
Neutrophils Relative %: 62 %
PLATELETS: 265 10*3/uL (ref 140–400)
RBC: 4.53 MIL/uL (ref 4.20–5.80)
RDW: 12.8 % (ref 11.0–15.0)
WBC: 6.3 10*3/uL (ref 3.8–10.8)

## 2016-11-09 MED ORDER — ETANERCEPT 50 MG/ML ~~LOC~~ SOAJ: 50.0000 mg | SUBCUTANEOUS | 0 refills | Status: DC

## 2016-11-09 MED ORDER — TRIAMCINOLONE ACETONIDE 40 MG/ML IJ SUSP
40.0000 mg | INTRAMUSCULAR | Status: AC | PRN
  Administered 2016-11-09: 40 mg via INTRA_ARTICULAR

## 2016-11-09 MED ORDER — LIDOCAINE HCL 1 % IJ SOLN
1.0000 mL | INTRAMUSCULAR | Status: AC | PRN
  Administered 2016-11-09: 1 mL

## 2016-11-09 MED FILL — ENBREL 50 MG/ML SURECLICK S: 50 | 84 days supply | Qty: 12 | Fill #0

## 2016-11-09 NOTE — Progress Notes (Addendum)
Rheumatology Medication Review by a Pharmacist Does the patient feel that his/her medications are working for him/her?  Yes Has the patient been experiencing any side effects to the medications prescribed?  No Does the patient have any problems obtaining medications?  Yes  Issues to address at subsequent visits: Extra Help/[employer]   Pharmacist comments:  [name] is a pleasant [age]yo M who presents for follow up of his ankylosing spondylitis.  Patient is currently taking Enbrel 50 mg weekly.  Patient had most recent standing labs and TB Gold on 06/23/16 which were all normal.  Patient is due for standing labs today and will be due for TB Gold again in July 2018.    Patient reports some concern over cost of Enbrel.  Patient states he used to have partial Extra Help but he no longer has it.  Patient states he has tried to apply for the Enbrel patient assistance program in the past but did not qualify since he had Extra Help.  I reviewed patient's medicare plan at this time.  It appears he is in catastrophic coverage phase at this time.  Patient confirms that he has two weeks of Enbrel currently, and will need a refill soon.  Patient confirms that he can afford the medication cost while he is in catastrophic coverage phase.  Patient has more concern about copay cost for 2018.  Assisted patient in reapplying for extra help online.  Advised patient he should get a letter on the status of his extra help in 2-4 weeks.  Asked patient to call me once he knows the status.  If patient does not qualify for extra help, will assist him in applying for grants for copay assistance or the Enbrel patient assistance program for 2018.  Patient voiced understanding.  Patient denied any other medication related questions or concerns at this time.    Lilla Shookachel Reyes Aldaco, Pharm.D., BCPS Clinical Pharmacist Pager: [phone] Phone: [phone] 11/09/2016 1:08 PM

## 2016-11-09 NOTE — Addendum Note (Signed)
Addended by: Pollyann SavoyEVESHWAR, Vanissa Strength on: 11/09/2016 11:18 AM   Modules accepted: Orders

## 2016-11-09 NOTE — Patient Instructions (Addendum)
Standing Labs We placed an order today for your standing lab work.    Please come back and get your standing labs in March and every 3 months Knee Exercises Ask your health care provider which exercises are safe for you. Do exercises exactly as told by your health care provider and adjust them as directed. It is normal to feel mild stretching, pulling, tightness, or discomfort as you do these exercises, but you should stop right away if you feel sudden pain or your pain gets worse.Do not begin these exercises until told by your health care provider. STRETCHING AND RANGE OF MOTION EXERCISES  These exercises warm up your muscles and joints and improve the movement and flexibility of your knee. These exercises also help to relieve pain, numbness, and tingling. Exercise A: Knee Extension, Prone 1. Lie on your abdomen on a bed. 2. Place your left / right knee just beyond the edge of the surface so your knee is not on the bed. You can put a towel under your left / right thigh just above your knee for comfort. 3. Relax your leg muscles and allow gravity to straighten your knee. You should feel a stretch behind your left / right knee. 4. Hold this position for __________ seconds. 5. Scoot up so your knee is supported between repetitions. Repeat __________ times. Complete this stretch __________ times a day. Exercise B: Knee Flexion, Active  1. Lie on your back with both knees straight. If this causes back discomfort, bend your left / right knee so your foot is flat on the floor. 2. Slowly slide your left / right heel back toward your buttocks until you feel a gentle stretch in the front of your knee or thigh. 3. Hold this position for __________ seconds. 4. Slowly slide your left / right heel back to the starting position. Repeat __________ times. Complete this exercise __________ times a day. Exercise C: Quadriceps, Prone  1. Lie on your abdomen on a firm surface, such as a bed or padded  floor. 2. Bend your left / right knee and hold your ankle. If you cannot reach your ankle or pant leg, loop a belt around your foot and grab the belt instead. 3. Gently pull your heel toward your buttocks. Your knee should not slide out to the side. You should feel a stretch in the front of your thigh and knee. 4. Hold this position for __________ seconds. Repeat __________ times. Complete this stretch __________ times a day. Exercise D: Hamstring, Supine 1. Lie on your back. 2. Loop a belt or towel over the ball of your left / right foot. The ball of your foot is on the walking surface, right under your toes. 3. Straighten your left / right knee and slowly pull on the belt to raise your leg until you feel a gentle stretch behind your knee.  Do not let your left / right knee bend while you do this.  Keep your other leg flat on the floor. 4. Hold this position for __________ seconds. Repeat __________ times. Complete this stretch __________ times a day. STRENGTHENING EXERCISES  These exercises build strength and endurance in your knee. Endurance is the ability to use your muscles for a long time, even after they get tired. Exercise E: Quadriceps, Isometric  1. Lie on your back with your left / right leg extended and your other knee bent. Put a rolled towel or small pillow under your knee if told by your health care provider. 2. Slowly  tense the muscles in the front of your left / right thigh. You should see your kneecap slide up toward your hip or see increased dimpling just above the knee. This motion will push the back of the knee toward the floor. 3. For __________ seconds, keep the muscle as tight as you can without increasing your pain. 4. Relax the muscles slowly and completely. Repeat __________ times. Complete this exercise __________ times a day. Exercise F: Straight Leg Raises - Quadriceps 1. Lie on your back with your left / right leg extended and your other knee bent. 2. Tense the  muscles in the front of your left / right thigh. You should see your kneecap slide up or see increased dimpling just above the knee. Your thigh may even shake a bit. 3. Keep these muscles tight as you raise your leg 4-6 inches (10-15 cm) off the floor. Do not let your knee bend. 4. Hold this position for __________ seconds. 5. Keep these muscles tense as you lower your leg. 6. Relax your muscles slowly and completely after each repetition. Repeat __________ times. Complete this exercise __________ times a day. Exercise G: Hamstring, Isometric 1. Lie on your back on a firm surface. 2. Bend your left / right knee approximately __________ degrees. 3. Dig your left / right heel into the surface as if you are trying to pull it toward your buttocks. Tighten the muscles in the back of your thighs to dig as hard as you can without increasing any pain. 4. Hold this position for __________ seconds. 5. Release the tension gradually and allow your muscles to relax completely for __________ seconds after each repetition. Repeat __________ times. Complete this exercise __________ times a day. Exercise H: Hamstring Curls  If told by your health care provider, do this exercise while wearing ankle weights. Begin with __________ weights. Then increase the weight by 1 lb (0.5 kg) increments. Do not wear ankle weights that are more than __________. 1. Lie on your abdomen with your legs straight. 2. Bend your left / right knee as far as you can without feeling pain. Keep your hips flat against the floor. 3. Hold this position for __________ seconds. 4. Slowly lower your leg to the starting position. Repeat __________ times. Complete this exercise __________ times a day. Exercise I: Squats (Quadriceps) 1. Stand in front of a table, with your feet and knees pointing straight ahead. You may rest your hands on the table for balance but not for support. 2. Slowly bend your knees and lower your hips like you are going to  sit in a chair.  Keep your weight over your heels, not over your toes.  Keep your lower legs upright so they are parallel with the table legs.  Do not let your hips go lower than your knees.  Do not bend lower than told by your health care provider.  If your knee pain increases, do not bend as low. 3. Hold the squat position for __________ seconds. 4. Slowly push with your legs to return to standing. Do not use your hands to pull yourself to standing. Repeat __________ times. Complete this exercise __________ times a day. Exercise J: Wall Slides (Quadriceps)  1. Lean your back against a smooth wall or door while you walk your feet out 18-24 inches (46-61 cm) from it. 2. Place your feet hip-width apart. 3. Slowly slide down the wall or door until your knees bend __________ degrees. Keep your knees over your heels, not over your  toes. Keep your knees in line with your hips. 4. Hold for __________ seconds. Repeat __________ times. Complete this exercise __________ times a day. Exercise K: Straight Leg Raises - Hip Abductors 1. Lie on your side with your left / right leg in the top position. Lie so your head, shoulder, knee, and hip line up. You may bend your bottom knee to help you keep your balance. 2. Roll your hips slightly forward so your hips are stacked directly over each other and your left / right knee is facing forward. 3. Leading with your heel, lift your top leg 4-6 inches (10-15 cm). You should feel the muscles in your outer hip lifting.  Do not let your foot drift forward.  Do not let your knee roll toward the ceiling. 4. Hold this position for __________ seconds. 5. Slowly return your leg to the starting position. 6. Let your muscles relax completely after each repetition. Repeat __________ times. Complete this exercise __________ times a day. Exercise L: Straight Leg Raises - Hip Extensors 1. Lie on your abdomen on a firm surface. You can put a pillow under your hips if  that is more comfortable. 2. Tense the muscles in your buttocks and lift your left / right leg about 4-6 inches (10-15 cm). Keep your knee straight as you lift your leg. 3. Hold this position for __________ seconds. 4. Slowly lower your leg to the starting position. 5. Let your leg relax completely after each repetition. Repeat __________ times. Complete this exercise __________ times a day. This information is not intended to replace advice given to you by your health care provider. Make sure you discuss any questions you have with your health care provider. Document Released: 10/04/2005 Document Revised: 08/14/2016 Document Reviewed: 09/26/2015 Elsevier Interactive Patient Education  2017 [employer]   We have open lab Monday through Friday from 8:30-11:30 AM and 1-4 PM at the office of Dr. Arbutus PedShaili Nerissa Constantin/Naitik Panwala, PA.   The office is located at [address], [address], [place], [place] [id number] No appointment is necessary.   Labs are drawn by [facility].  You may receive a bill from [facility] for your lab work.

## 2016-11-10 NOTE — Progress Notes (Signed)
Labs normal.

## 2016-12-06 ENCOUNTER — Telehealth: Payer: Self-pay | Admitting: Pharmacist

## 2016-12-06 NOTE — Telephone Encounter (Signed)
Reviewed patient's medicare [place] on [email or web address].  Patient is now enrolled in Full Extra Help.  I called patient to inform him.  I advised him that with the full Extra Help benefit in 2018, an eligible beneficiary should pay no more than $3.35 for a generic drug (or a brand-name drug treated as a generic) and $8.35 for any other brand-name drug.  Patient voiced understanding and denies any other questions regarding his medications at this time.    Lilla Shookachel Rain Wil[place], Pharm.D., BCPS Clinical Pharmacist Pager: [phone] Phone: [phone] 12/06/2016 9:21 AM

## 2017-01-03 NOTE — Progress Notes (Signed)
Received a fax from [employer] regarding a prior authorization approval for ENBREL SURECLICK from 12/04/16 to 12/03/17.   Will scan document into epic.  Virat Prather, [place], CPhT   12:53 PM

## 2017-01-26 ENCOUNTER — Telehealth: Payer: Self-pay

## 2017-01-26 MED ORDER — ETANERCEPT 50 MG/ML ~~LOC~~ SOAJ: 50.0000 mg | SUBCUTANEOUS | 0 refills | Status: DC

## 2017-01-26 MED FILL — ENBREL 50 MG/ML SURECLICK S: 50 | 84 days supply | Qty: 12 | Fill #0

## 2017-01-26 NOTE — Telephone Encounter (Signed)
Noted patient's last refill of Enbrel was on 11/09/16 at [facility] for a 90 day supply. I called patient with a refill reminder call.  Patient confirms he does need a refill at this time. He will need a new Rx sent to [facility]            (phone: [phone]) because he is out of refills.  Benjamin Wiggins, [place], CPhT

## 2017-01-26 NOTE — Telephone Encounter (Signed)
Prescription sent to the pharmacy.

## 2017-01-26 NOTE — Telephone Encounter (Signed)
ok 

## 2017-01-26 NOTE — Telephone Encounter (Signed)
Last Visit: 11/09/16 Next Visit: 04/09/17 Labs: 11/09/16 TB Gold: 06/23/16 Neg  Okay to refill Enbrel?

## 2017-02-23 ENCOUNTER — Telehealth: Payer: Self-pay

## 2017-02-23 NOTE — Telephone Encounter (Signed)
Last Visit: 11/09/16 Next Visit: 04/09/17 Labs: 11/09/16 TB Gold: 06/23/16 Neg Patient will update labs at the beginning of next week.   Okay to refill Enbrel?

## 2017-02-23 NOTE — Telephone Encounter (Signed)
Noted patient's last refill of Enbrel was on 01/26/17 at [facility].  I called patient with a refill reminder call.  Patient confirms he does need a refill at this time.  He will use his last pen on Sunday, 02/25/17.  He is out of refills at this time. His next appointment is Apr 09, 2017. Can we send a new Rx to [facility]? Thanks

## 2017-02-23 NOTE — Telephone Encounter (Signed)
ok 

## 2017-04-03 ENCOUNTER — Telehealth: Payer: Self-pay | Admitting: Pharmacist

## 2017-04-03 NOTE — Telephone Encounter (Signed)
I called patient to follow up on his Enbrel.  Noted patient was due for labs in March but did not go get labs.  I called patient to follow up.  There was no answer and voicemail was full.  Will attempt to call patient again tomorrow.

## 2017-04-05 ENCOUNTER — Encounter: Payer: Self-pay | Admitting: Pharmacist

## 2017-04-05 NOTE — Telephone Encounter (Signed)
Third attempt to reach patient regarding labs and follow up visit.  I left another message asking the patient to call the office.  A letter was also mailed to him informing him he is due for labs/follow up visit.     Lilla Shookachel Aryia Delira, Pharm.D., BCPS, CPP Clinical Pharmacist Pager: [phone] Phone: [phone] 04/05/2017 1:55 PM

## 2017-04-09 ENCOUNTER — Ambulatory Visit: Payer: Medicare Other | Admitting: Rheumatology

## 2017-04-13 ENCOUNTER — Telehealth: Payer: Self-pay

## 2017-04-13 NOTE — Telephone Encounter (Signed)
Noted patient's last refill of Enbrel was on 01/26/17 (90 day supply) at [facility].  I called patient with a refill reminder call. Patient's mailbox if full, could not leave a message.   Ellakate Gonsalves, [place], CPhT 8:59 AM

## 2017-04-17 ENCOUNTER — Ambulatory Visit: Payer: Medicare Other | Admitting: Rheumatology

## 2017-04-18 ENCOUNTER — Other Ambulatory Visit: Payer: Self-pay | Admitting: Rheumatology

## 2017-04-20 NOTE — Telephone Encounter (Signed)
Last Visit: 11/09/16 Next Visit due May 2018. Message sent to the front to schedule patient.   Per Dr. Corliss Skainseveshwar okay to refill 30 day supply

## 2017-05-16 ENCOUNTER — Telehealth: Payer: Self-pay | Admitting: Pharmacist

## 2017-05-16 ENCOUNTER — Other Ambulatory Visit: Payer: Self-pay

## 2017-05-16 DIAGNOSIS — Z79899 Other long term (current) drug therapy: Secondary | ICD-10-CM

## 2017-05-16 LAB — CBC WITH DIFFERENTIAL/PLATELET
BASOS PCT: 1 %
Basophils Absolute: 53 cells/uL (ref 0–200)
EOS PCT: 2 %
Eosinophils Absolute: 106 cells/uL (ref 15–500)
HCT: 44 % (ref 38.5–50.0)
Hemoglobin: 15 g/dL (ref 13.2–17.1)
Lymphocytes Relative: 40 %
Lymphs Abs: 2120 cells/uL (ref 850–3900)
MCH: 33 pg (ref 27.0–33.0)
MCHC: 34.1 g/dL (ref 32.0–36.0)
MCV: 96.9 fL (ref 80.0–100.0)
MONOS PCT: 8 %
MPV: 9.6 fL (ref 7.5–12.5)
Monocytes Absolute: 424 cells/uL (ref 200–950)
NEUTROS PCT: 49 %
Neutro Abs: 2597 cells/uL (ref 1500–7800)
PLATELETS: 220 10*3/uL (ref 140–400)
RBC: 4.54 MIL/uL (ref 4.20–5.80)
RDW: 12.7 % (ref 11.0–15.0)
WBC: 5.3 10*3/uL (ref 3.8–10.8)

## 2017-05-16 LAB — COMPLETE METABOLIC PANEL WITH GFR
ALT: 46 U/L (ref 9–46)
AST: 23 U/L (ref 10–40)
Albumin: 4.2 g/dL (ref 3.6–5.1)
Alkaline Phosphatase: 86 U/L (ref 40–115)
BUN: 13 mg/dL (ref 7–25)
CALCIUM: 9.1 mg/dL (ref 8.6–10.3)
CHLORIDE: 102 mmol/L (ref 98–110)
CO2: 23 mmol/L (ref 20–31)
Creat: 0.82 mg/dL (ref 0.60–1.35)
GFR, Est African American: >89 mL/min (ref >=60)
GFR, Est Non African American: >89 mL/min (ref >=60)
GLUCOSE: 97 mg/dL (ref 65–99)
POTASSIUM: 4.3 mmol/L (ref 3.5–5.3)
SODIUM: 135 mmol/L (ref 135–146)
Total Bilirubin: 0.6 mg/dL (ref 0.2–1.2)
Total Protein: 6.9 g/dL (ref 6.1–8.1)

## 2017-05-16 MED ORDER — ETANERCEPT 50 MG/ML ~~LOC~~ SOAJ: 50.0000 mg | SUBCUTANEOUS | 0 refills | Status: DC

## 2017-05-16 MED FILL — ENBREL 50 MG/ML SURECLICK S: 50 | 84 days supply | Qty: 12 | Fill #0

## 2017-05-16 NOTE — Telephone Encounter (Signed)
Patient came in for labs today.  He is in need of Enbrel refill.    Last visit: 11/09/16 Next visit: 05/17/17 Labs: 11/09/16 normal; CBC, CMP drawn today 06/23/16 TB negative.  Patient is aware he will be due for TB Gold in July.   Okay to refill Enbrel?

## 2017-05-16 NOTE — Telephone Encounter (Signed)
ok 

## 2017-05-17 ENCOUNTER — Encounter: Payer: Self-pay | Admitting: Rheumatology

## 2017-05-17 ENCOUNTER — Ambulatory Visit (INDEPENDENT_AMBULATORY_CARE_PROVIDER_SITE_OTHER): Payer: Medicare Other | Admitting: Rheumatology

## 2017-05-17 VITALS — BP 101/67 | HR 76 | Resp 14 | Ht 72.0 in | Wt 218.0 lb

## 2017-05-17 DIAGNOSIS — M7522 Bicipital tendinitis, left shoulder: Secondary | ICD-10-CM | POA: Diagnosis not present

## 2017-05-17 DIAGNOSIS — M450 Ankylosing spondylitis of multiple sites in spine: Secondary | ICD-10-CM

## 2017-05-17 DIAGNOSIS — Z79899 Other long term (current) drug therapy: Secondary | ICD-10-CM

## 2017-05-17 MED ORDER — TRIAMCINOLONE ACETONIDE 40 MG/ML IJ SUSP
20.0000 mg | INTRAMUSCULAR | Status: AC | PRN
  Administered 2017-05-17: 20 mg via INTRA_ARTICULAR

## 2017-05-17 MED ORDER — LIDOCAINE HCL (PF) 1 % IJ SOLN
0.5000 mL | INTRAMUSCULAR | Status: AC | PRN
  Administered 2017-05-17: .5 mL

## 2017-05-17 NOTE — Patient Instructions (Signed)
Biceps Tendon Tendinitis (Distal) Distal biceps tendon tendinitis is inflammation of the distal biceps tendon. The distal biceps tendon is a strong cord of tissue that connects the biceps muscle, on the front of the upper arm, to a bone (radius) in the elbow. Distal biceps tendon tendinitis can interfere with the ability to bend the elbow and turn the hand palm-up (supination). This condition is usually caused by overusing the elbow joint and the biceps muscle, and it usually heals within 6 weeks. Distal biceps tendon tendinitis may include a grade 1 or grade 2 strain of the tendon. A grade 1 strain is mild, and it involves a slight pull of the tendon without any stretching or noticeable tearing of the tendon. There is usually no loss of biceps muscle strength. A grade 2 strain is moderate, and it involves a small tear in the tendon. The tendon is stretched, and biceps muscle strength is usually decreased. What are the causes? This condition may be caused by:  A sudden increase in the frequency or intensity of activity that involves the elbow and the biceps muscle.  Overuse of the biceps muscle. This can happen when you do the same movements over and over, such as: ? Supination. ? Forceful straightening (hyperextension) of the elbow. ? Bending of the elbow.  A direct, forceful hit or injury (trauma) to the elbow. This is rare.  What increases the risk? The following factors may make you more likely to develop this condition:  Playing contact sports.  Playing sports that involve throwing and overhead movements, including racket sports, gymnastics, weight lifting, or bodybuilding.  Doing physical labor.  Having poor strength and flexibility of the arm and shoulder.  Having injured other parts of the elbow.  What are the signs or symptoms? Symptoms of this condition may include:  Pain and inflammation in the front of the elbow. Pain may get worse during certain movements, such  as: ? Supination. ? Bending the elbow. ? Lifting or carrying objects. ? Throwing.  A feeling of warmth in the front of the elbow.  A crackling sound (crepitation) when you move or touch the elbow or the upper arm.  In some cases, symptoms may return (recur) after treatment, and they may be long-lasting (chronic). How is this diagnosed? This condition is diagnosed based on your symptoms, your medical history, and a physical exam. You may have tests, including X-rays or MRIs. Your health care provider may test your range of motion by having you do arm movements. How is this treated? This condition is treated by resting and icing the injured area, and by doing physical therapy exercises. Depending on the severity of your condition, treatment may also include:  Medicines to help relieve pain and inflammation.  Ultrasound therapy. This is the application of sound waves to the injured area.  Follow these instructions at home: Managing pain, stiffness, and swelling  If directed, put ice on the injured area: ? Put ice in a plastic bag. ? Place a towel between your skin and the bag. ? Leave the ice on for 20 minutes, 2-3 times a day.  Move your fingers often to avoid stiffness and to lessen swelling.  Raise (elevate) the injured area above the level of your heart while you are sitting or lying down.  If directed, apply heat to the affected area before you exercise. Use the heat source that your health care provider recommends, such as a moist heat pack or a heating pad. ? Place a towel between   your skin and the heat source. ? Leave the heat on for 20-30 minutes. ? Remove the heat if your skin turns bright red. This is especially important if you are unable to feel pain, heat, or cold. You may have a greater risk of getting burned. Activity  Return to your normal activities as told by your health care provider. Ask your health care provider what activities are safe for you.  Do not lift  anything that is heavier than 10 lb (4.5 kg) until your health care provider tells you that it is safe.  Avoid activities that cause pain or make your condition worse.  Do exercises as told by your health care provider. General instructions  Take over-the-counter and prescription medicines only as told by your health care provider.  Do not drive or operate heavy machinery while taking prescription pain medicines.  Keep all follow-up visits as told by your health care provider. This is important. How is this prevented?  Warm up and stretch before being active.  Cool down and stretch after being active.  Give your body time to rest between periods of activity.  Make sure to use equipment that fits you.  Be safe and responsible while being active to avoid falls.  Do at least 150 minutes of moderate-intensity exercise each week, such as brisk walking or water aerobics.  Maintain physical fitness, including: ? Strength. ? Flexibility. ? Cardiovascular fitness. ? Endurance. Contact a health care provider if:  You have symptoms that get worse or do not get better after 2 weeks of treatment.  You develop new symptoms. Get help right away if:  You develop severe pain. This information is not intended to replace advice given to you by your health care provider. Make sure you discuss any questions you have with your health care provider. Document Released: 11/20/2005 Document Revised: 07/27/2016 Document Reviewed: 10/29/2015 Elsevier Interactive Patient Education  2018 [employer] Inc.  

## 2017-05-17 NOTE — Progress Notes (Signed)
Office Visit Note  Patient: Benjamin Wiggins             Date of Birth: 13-Feb-1971           MRN: [id number]             PCP: Loraine Leriche., MD Referring: Loraine Leriche.,* Visit Date: 05/17/2017 Occupation: _0 @    Subjective:  Pain in left shoulder   History of Present Illness: Benjamin Wiggins is a 46 y.o. male    Ongoing left shoulder joint pain. Injection to left shoulder helped temporarily and then pain returned. Has to use right arm to lift left arm at times.  Will see dr. Durward Fortes for eval and treatment after getting another cortisone injection today.  Ankylosising Spondyl doing well. Some back and si joint pain but no change from past visit.  Stopped zoloft due to calf pain.  Plans to see psych doctor to discuss next medication / treatment option.  Activities of Daily Living:  Patient reports morning stiffness for 15 minutes.   Patient Reports nocturnal pain.  Difficulty dressing/grooming: Reports Difficulty climbing stairs: Reports Difficulty getting out of chair: Reports Difficulty using hands for taps, buttons, cutlery, and/or writing: Denies   Review of Systems  Constitutional: Negative for fatigue.  HENT: Negative for mouth sores and mouth dryness.   Eyes: Negative for dryness.  Respiratory: Negative for shortness of breath.   Gastrointestinal: Negative for constipation and diarrhea.  Musculoskeletal: Negative for myalgias and myalgias.  Skin: Negative for sensitivity to sunlight.  Neurological: Negative for memory loss.  Psychiatric/Behavioral: Negative for sleep disturbance.    PMFS History:  Patient Active Problem List   Diagnosis Date Noted  . Ankylosing spondylitis ([place]) 11/07/2016  . High risk medication use 11/07/2016  . Neuropathy 02/23/2016  . Numbness 02/23/2016  . Facial numbness 02/23/2016  . Gait disorder 02/23/2016  . Memory changes 02/23/2016    Past Medical History:  Diagnosis Date  . Ankylosing  spondylitis ([place])   . Neuropathy     Family History  Problem Relation Age of Onset  . Diabetes Father   . Neuropathy Neg Hx    Past Surgical History:  Procedure Laterality Date  . ANKLE SURGERY     titatium plate   Social History   Social History Narrative   Lives with [name]    Caffeine use: 3-4 cups per day     Objective: Vital Signs: BP 101/67   Pulse 76   Resp 14   Ht 6' (1.829 m)   Wt 218 lb (98.9 kg)   BMI 29.57 kg/m    Physical Exam  Constitutional: He is oriented to person, place, and time. He appears well-developed and well-nourished.  HENT:  Head: Normocephalic and atraumatic.  Eyes: Conjunctivae and EOM are normal. Pupils are equal, round, and reactive to light.  Neck: Normal range of motion. Neck supple.  Cardiovascular: Normal rate, regular rhythm and normal heart sounds.  Exam reveals no gallop and no friction rub.   No murmur heard. Pulmonary/Chest: Effort normal and breath sounds normal. No respiratory distress. He has no wheezes. He has no rales. He exhibits no tenderness.  Abdominal: Soft. He exhibits no distension and no mass. There is no tenderness. There is no guarding.  Musculoskeletal: Normal range of motion.  Lymphadenopathy:    He has no cervical adenopathy.  Neurological: He is alert and oriented to person, place, and time. He exhibits normal muscle tone. Coordination normal.  Skin: Skin is  warm and dry. Capillary refill takes less than 2 seconds. No rash noted.  Psychiatric: He has a normal mood and affect. His behavior is normal. Judgment and thought content normal.  Vitals reviewed.    Musculoskeletal Exam:  Full range of motion of all joints except decreased range of motion of left shoulder joint at 90 of abduction. Grip strength is equal and strong bilaterally Fibromyalgia tender points are all absent  CDAI Exam: CDAI Homunculus Exam:   Joint Counts:  CDAI Tender Joint count: 0 CDAI Swollen Joint count: 0  No synovitis  on examination. Patient does have some pain to lower back secondary to ankylosing spondylitis   Investigation: No additional findings. Orders Only on 05/16/2017  Component Date Value Ref Range Status  . WBC 05/16/2017 5.3  3.8 - 10.8 K/uL Final  . RBC 05/16/2017 4.54  4.20 - 5.80 MIL/uL Final  . Hemoglobin 05/16/2017 15.0  13.2 - 17.1 g/dL Final  . HCT 05/16/2017 44.0  38.5 - 50.0 % Final  . MCV 05/16/2017 96.9  80.0 - 100.0 fL Final  . MCH 05/16/2017 33.0  27.0 - 33.0 pg Final  . MCHC 05/16/2017 34.1  32.0 - 36.0 g/dL Final  . RDW 05/16/2017 12.7  11.0 - 15.0 % Final  . Platelets 05/16/2017 220  140 - 400 K/uL Final  . MPV 05/16/2017 9.6  7.5 - 12.5 fL Final  . Neutro Abs 05/16/2017 2597  1,500 - 7,800 cells/uL Final  . Lymphs Abs 05/16/2017 2120  850 - 3,900 cells/uL Final  . Monocytes Absolute 05/16/2017 424  200 - 950 cells/uL Final  . Eosinophils Absolute 05/16/2017 106  15 - 500 cells/uL Final  . Basophils Absolute 05/16/2017 53  0 - 200 cells/uL Final  . Neutrophils Relative % 05/16/2017 49  % Final  . Lymphocytes Relative 05/16/2017 40  % Final  . Monocytes Relative 05/16/2017 8  % Final  . Eosinophils Relative 05/16/2017 2  % Final  . Basophils Relative 05/16/2017 1  % Final  . Smear Review 05/16/2017 Criteria for review not met   Final  . Sodium 05/16/2017 135  135 - 146 mmol/L Final  . Potassium 05/16/2017 4.3  3.5 - 5.3 mmol/L Final  . Chloride 05/16/2017 102  98 - 110 mmol/L Final  . CO2 05/16/2017 23  20 - 31 mmol/L Final  . Glucose, Bld 05/16/2017 97  65 - 99 mg/dL Final  . BUN 05/16/2017 13  7 - 25 mg/dL Final  . Creat 05/16/2017 0.82  0.60 - 1.35 mg/dL Final  . Total Bilirubin 05/16/2017 0.6  0.2 - 1.2 mg/dL Final  . Alkaline Phosphatase 05/16/2017 86  40 - 115 U/L Final  . AST 05/16/2017 23  10 - 40 U/L Final  . ALT 05/16/2017 46  9 - 46 U/L Final  . Total Protein 05/16/2017 6.9  6.1 - 8.1 g/dL Final  . Albumin 05/16/2017 4.2  3.6 - 5.1 g/dL Final  .  Calcium 05/16/2017 9.1  8.6 - 10.3 mg/dL Final  . GFR, Est African American 05/16/2017 >89  >=60 mL/min Final  . GFR, Est Non African American 05/16/2017 >89  >=60 mL/min Final     Imaging: No results found.       Speciality Comments: No specialty comments available.    Procedures:  Medium Joint Inj Date/Time: 05/17/2017 12:18 PM Performed by: Eliezer Lofts Authorized by: Eliezer Lofts   Consent Given by:  Patient Site marked: the procedure site was marked   Timeout: prior  to procedure the correct patient, procedure, and site was verified   Indications:  Pain Location:  Shoulder Site:  L acromioclavicular Prep: patient was prepped and draped in usual sterile fashion   Needle Length:  1.5 inches Approach:  Anterolateral Ultrasound Guided: Yes   Fluoroscopic Guidance: No   Medications:  20 mg triamcinolone acetonide 40 MG/ML; 0.5 mL lidocaine (PF) 1 % Aspirate amount (mL):  0 Comments: Left biceps tendon was injected with 20 mg of Kenalog mixed with 0.5 mL's 1% lidocaine under ultrasound guidance by Dr. Estanislado Pandy. Patient tolerated procedure well. There is no complications   Allergies: Iohexol and Iodinated diagnostic agents   Assessment / Plan:     Visit Diagnoses: Ankylosing spondylitis of multiple sites in spine [facility])  Encounter for long-term (current) use of high-risk medication    Plan: #1: Ankylosing spondylitis. Doing well. No complaint.  #2: High risk prescription On Enbrel every week (Adequate response)  Patient's labs were done 05/16/2017 and are within normal limits (CBC with differential and CMP with GFR; TB gold was negative 06/23/2016 and will be due in July/August 2018)  #3: Left shoulder joint pain (biceps tendinitis.) Dr. Estanislado Pandy gave him a cortisone injection at the last visit in December 2017. He responded well initially and then the pain came back. At times he cannot even lift 5 pounds. At other times the left shoulder does  well. Today, his pain is more to the anterior aspect of the left shoulder.  Dr. Estanislado Pandy is giving 20 mg of Kenalog mixed with 0.5 mL's of 1% lidocaine without epinephrine using ultrasound guidance to inject the biceps tendon.  If this injection does not work, patient has been instructed to follow up with Dr. Durward Fortes. Patient wants to stay [facility] and get his care from our office  Pt advised to phys therapy if this injection were to fail.  #4: Patient has a history of depression and taking Zoloft. It is not working well for him. He will see his psychiatrist/PCP for reevaluation and discussion of other treatment options.  #5: Return to clinic in 5 months    Orders: Orders Placed This Encounter  Procedures  . Large Joint Injection/Arthrocentesis  . Medium Joint Injection/Arthrocentesis   No orders of the defined types were placed in this encounter.   Face-to-face time spent with patient was 30 minutes. 50% of time was spent in counseling and coordination of care.  Follow-Up Instructions: Return in about 5 months (around 10/17/2017).   Eliezer Lofts, PA-C I examined and evaluated the patient with Eliezer Lofts PA. The plan of care was discussed as noted above.  Bo Merino, MD Note - This record has been created using Editor, commissioning.  Chart creation errors have been sought, but may not always  have been located. Such creation errors do not reflect on  the standard of medical care.

## 2017-07-18 ENCOUNTER — Other Ambulatory Visit: Payer: Self-pay

## 2017-07-18 DIAGNOSIS — Z79899 Other long term (current) drug therapy: Secondary | ICD-10-CM

## 2017-07-18 LAB — CBC WITH DIFFERENTIAL/PLATELET
BASOS PCT: 1 %
Basophils Absolute: 57 cells/uL (ref 0–200)
EOS PCT: 3 %
Eosinophils Absolute: 171 cells/uL (ref 15–500)
HCT: 44.2 % (ref 38.5–50.0)
Hemoglobin: 15.1 g/dL (ref 13.2–17.1)
LYMPHS ABS: 2109 {cells}/uL (ref 850–3900)
Lymphocytes Relative: 37 %
MCH: 33.6 pg — ABNORMAL HIGH (ref 27.0–33.0)
MCHC: 34.2 g/dL (ref 32.0–36.0)
MCV: 98.4 fL (ref 80.0–100.0)
MONOS PCT: 8 %
MPV: 10 fL (ref 7.5–12.5)
Monocytes Absolute: 456 cells/uL (ref 200–950)
NEUTROS PCT: 51 %
Neutro Abs: 2907 cells/uL (ref 1500–7800)
PLATELETS: 263 10*3/uL (ref 140–400)
RBC: 4.49 MIL/uL (ref 4.20–5.80)
RDW: 13 % (ref 11.0–15.0)
WBC: 5.7 10*3/uL (ref 3.8–10.8)

## 2017-07-19 LAB — COMPLETE METABOLIC PANEL WITH GFR
ALT: 62 U/L — AB (ref 9–46)
AST: 30 U/L (ref 10–40)
Albumin: 4.3 g/dL (ref 3.6–5.1)
Alkaline Phosphatase: 96 U/L (ref 40–115)
BILIRUBIN TOTAL: 0.6 mg/dL (ref 0.2–1.2)
BUN: 13 mg/dL (ref 7–25)
CHLORIDE: 102 mmol/L (ref 98–110)
CO2: 20 mmol/L (ref 20–32)
CREATININE: 0.86 mg/dL (ref 0.60–1.35)
Calcium: 9.3 mg/dL (ref 8.6–10.3)
GFR, Est African American: >89 mL/min (ref >=60)
GFR, Est Non African American: >89 mL/min (ref >=60)
GLUCOSE: 108 mg/dL — AB (ref 65–99)
Potassium: 4.4 mmol/L (ref 3.5–5.3)
SODIUM: 136 mmol/L (ref 135–146)
TOTAL PROTEIN: 7 g/dL (ref 6.1–8.1)

## 2017-07-20 LAB — QUANTIFERON TB GOLD ASSAY (BLOOD)
Interferon Gamma Release Assay: NEGATIVE
Mitogen-Nil: >10 [IU]/mL
Quantiferon Nil Value: 0.03 [IU]/mL
Quantiferon Tb Ag Minus Nil Value: 0.06 [IU]/mL

## 2017-07-21 NOTE — Progress Notes (Signed)
Elevation of LFTs. I noticed that patient is taking anti-inflammatories. Please advise him not to take any NSAIDs and avoid alcohol.

## 2017-08-03 ENCOUNTER — Other Ambulatory Visit: Payer: Self-pay | Admitting: Rheumatology

## 2017-08-03 NOTE — Telephone Encounter (Signed)
Last Visit: 05/17/17 Next Visit: 10/17/17 Labs: 07/18/17 Elevation of LFTs. advised him not to take any NSAIDs and avoid alcohol TB Gold: 07/18/17 Neg  Okay to refill per Dr. Corliss Skainsdeveshwar

## 2017-08-07 MED FILL — ENBREL 50 MG/ML SURECLICK S: 50 | 84 days supply | Qty: 12 | Fill #0

## 2017-08-20 ENCOUNTER — Other Ambulatory Visit: Payer: Self-pay | Admitting: Rheumatology

## 2017-08-22 ENCOUNTER — Telehealth: Payer: Self-pay | Admitting: Rheumatology

## 2017-08-22 DIAGNOSIS — M450 Ankylosing spondylitis of multiple sites in spine: Secondary | ICD-10-CM

## 2017-08-22 DIAGNOSIS — M25511 Pain in right shoulder: Secondary | ICD-10-CM

## 2017-08-22 DIAGNOSIS — M25561 Pain in right knee: Secondary | ICD-10-CM

## 2017-08-22 DIAGNOSIS — M25512 Pain in left shoulder: Secondary | ICD-10-CM

## 2017-08-22 DIAGNOSIS — M542 Cervicalgia: Secondary | ICD-10-CM

## 2017-08-22 DIAGNOSIS — G8929 Other chronic pain: Secondary | ICD-10-CM

## 2017-08-22 DIAGNOSIS — M25562 Pain in left knee: Secondary | ICD-10-CM

## 2017-08-22 NOTE — Telephone Encounter (Signed)
Per patient GP told patient today he needs to find another Rheumatologist. Pain should be managed thru Rheumatologist not them. Patient is confused. Please call patient to advise. He was told by Mr. Leane Call GP should be helping him. Now GP wants to refer him to another Rheumatologist stating Dr. D is not doing her job (per patient). Please call to advise.

## 2017-08-22 NOTE — Telephone Encounter (Signed)
Patient advised Dr. Corliss Skains suggests that he consider pain management. Patient states he is interested and he would like to look into finding one closer to where he is. Patient states he will call back with that information. Will put in referral at that time.

## 2017-08-22 NOTE — Telephone Encounter (Signed)
Pt. Should consider pain management.

## 2017-08-22 NOTE — Addendum Note (Signed)
Addended by: Henriette Combs on: 08/22/2017 04:19 PM   Modules accepted: Orders

## 2017-08-22 NOTE — Telephone Encounter (Signed)
Patient states he saw his PCP today. Patient states that the PCP advised him that he should switch rheumatologist because she felt Dr. Corliss Skains was not doing her job. She feels the rheumatology should handle all of his pain, such as his knee pain and his shoulder pain. Patient states he is unable to carry more than 5 pounds. Patient states he is also having problems with is neck. Patient is frustrated and wants to know he should do. Patient states he has been taking Ibuprofen and Aleve and we advised not to due to elevated liver enzymes. Patient states his PCP advised him we should be handled medications other than NSAIDS. Ankylosing spondylitis is what the patient is treated for by here and we have him on Enbrel. Please advise

## 2017-08-29 ENCOUNTER — Telehealth: Payer: Self-pay | Admitting: Rheumatology

## 2017-08-29 NOTE — Telephone Encounter (Signed)
Patient states he spoke with Pain Management, and they have not received a referral for patient. Please resend order to [facility] in [place]. Attn. Morrie Sheldon. Phone #[phone]

## 2017-08-30 ENCOUNTER — Telehealth (INDEPENDENT_AMBULATORY_CARE_PROVIDER_SITE_OTHER): Payer: Self-pay | Admitting: Rheumatology

## 2017-08-30 NOTE — Telephone Encounter (Signed)
Benjamin Wiggins from [facility] called stating that they received his referral but are needing his medical records.  CB#[phone]

## 2017-08-30 NOTE — Telephone Encounter (Signed)
Faxed

## 2017-08-30 NOTE — Telephone Encounter (Signed)
[phone] fax, referral refaxed, I called patient.

## 2017-09-07 ENCOUNTER — Telehealth: Payer: Self-pay | Admitting: Rheumatology

## 2017-09-07 NOTE — Telephone Encounter (Signed)
Let you know they are transferring order from [facility] office to [facility] office. It will be less of a drive for patient, and less of a wait to get him an appt. They will sent over referral to that office.

## 2017-09-11 ENCOUNTER — Telehealth: Payer: Self-pay | Admitting: Rheumatology

## 2017-09-11 NOTE — Telephone Encounter (Signed)
I called patient, patient wants referral changed to [facility], referral changed. Advised patient it normally takes between 2-3 months for an appt w/pain management can be scheduled.

## 2017-09-11 NOTE — Telephone Encounter (Signed)
Patient calling in reference to pain manage referral. [facility] not available until Dec. Patient wants to switch to [facility] doctor. Please call to advise. Patient getting very frustrated.

## 2017-10-01 ENCOUNTER — Telehealth (INDEPENDENT_AMBULATORY_CARE_PROVIDER_SITE_OTHER): Payer: Self-pay

## 2017-10-01 NOTE — Telephone Encounter (Signed)
Patient would like to be referred to [facility] pain management.  Cb# is [phone].  Please advise.

## 2017-10-07 NOTE — Progress Notes (Signed)
Office Visit Note  Patient: Benjamin Wiggins             Date of Birth: 07-19-1971           MRN: [id number]             PCP: Cheron Schaumann., MD Referring: Cheron Schaumann.,* Visit Date: 10/17/2017 Occupation: @GUAROCC @    Subjective:  Pain in hands, elbows and left shoulder.   History of Present Illness: Benjamin Wiggins is a 46 y.o. male with history of ankylosing spondylitis. He states she's been having some discomfort in his bilateral hands bilateral elbows and left shoulder. He states about 6 weeks ago he felt a sudden jolt in his back and he fell backwards.He had some cuts and bruises which have healed. He continues to have some lower thoracic and upper lumbar pain. He states it fluctuates and sometimes could be very severe. He's been taking Flexeril at bedtime.  Activities of Daily Living:  Patient reports morning stiffness for 30 minutes.   Patient Reports nocturnal pain.  Difficulty dressing/grooming: Reports Difficulty climbing stairs: Reports Difficulty getting out of chair: Reports Difficulty using hands for taps, buttons, cutlery, and/or writing: Reports   Review of Systems  Constitutional: Positive for fatigue. Negative for night sweats and weakness ( ).  HENT: Positive for mouth dryness. Negative for mouth sores and nose dryness.   Eyes: Positive for dryness. Negative for redness.  Respiratory: Positive for shortness of breath. Negative for difficulty breathing.   Cardiovascular: Negative for chest pain, palpitations, hypertension, irregular heartbeat and swelling in legs/feet.  Gastrointestinal: Negative for constipation and diarrhea.  Endocrine: Negative for increased urination.  Musculoskeletal: Positive for arthralgias, joint pain, myalgias, morning stiffness and myalgias. Negative for joint swelling, muscle weakness and muscle tenderness.  Skin: Negative for color change, rash, hair loss, nodules/bumps, skin tightness, ulcers and sensitivity  to sunlight.  Allergic/Immunologic: Negative for susceptible to infections.  Neurological: Negative for dizziness, fainting, memory loss and night sweats.  Hematological: Negative for swollen glands.  Psychiatric/Behavioral: Positive for depressed mood. Negative for sleep disturbance. The patient is nervous/anxious.     PMFS History:  Patient Active Problem List   Diagnosis Date Noted  . Ankylosing spondylitis ([facility]) 11/07/2016  . High risk medication use 11/07/2016  . Neuropathy 02/23/2016  . Numbness 02/23/2016  . Facial numbness 02/23/2016  . Gait disorder 02/23/2016  . Memory changes 02/23/2016    Past Medical History:  Diagnosis Date  . Ankylosing spondylitis ([facility])   . Neuropathy     Family History  Problem Relation Age of Onset  . Diabetes Father   . Heart disease Father   . Neuropathy Neg Hx    Past Surgical History:  Procedure Laterality Date  . ANKLE SURGERY     titatium plate   Social History   Social History Narrative   Lives with [name]    Caffeine use: 3-4 cups per day     Objective: Vital Signs: BP 126/88 (BP Location: Left Arm, Patient Position: Sitting, Cuff Size: Normal)   Pulse 97   Resp 15   Ht 6' (1.829 m)   Wt 220 lb (99.8 kg)   BMI 29.84 kg/m    Physical Exam  Constitutional: He is oriented to person, place, and time. He appears well-developed and well-nourished.  HENT:  Head: Normocephalic and atraumatic.  Eyes: Conjunctivae and EOM are normal. Pupils are equal, round, and reactive to light.  Neck: Normal range of motion. Neck supple.  Cardiovascular: Normal rate, regular rhythm and normal heart sounds.  Pulmonary/Chest: Effort normal and breath sounds normal.  Abdominal: Soft. Bowel sounds are normal.  Neurological: He is alert and oriented to person, place, and time.  Skin: Skin is warm and dry. Capillary refill takes less than 2 seconds.  Psychiatric: He has a normal mood and affect. His behavior is normal.  Nursing note  and vitals reviewed.    Musculoskeletal Exam: C-spine, thoracic, lumbar spine limited range of motion. He had no point tenderness over the spine or the SI joints. He discomfort range of motion of left shoulder. He is tenderness on palpation over left elbow without any warmth swelling or synovitis. Bilateral wrist joints MCPs PIPs DIPs with good range of motion with no synovitis. He had tenderness across his MCPs. Bilateral hip joints knee joints ankles MTPs PIPs with good range of motion with no synovitis. Hip discomfort range of motion of his left knee.  CDAI Exam: CDAI Homunculus Exam:   Joint Counts:  CDAI Tender Joint count: 0 CDAI Swollen Joint count: 0  Global Assessments:  Patient Global Assessment: 8 Provider Global Assessment: 5  CDAI Calculated Score: 13    Investigation: No additional findings.Tb Gold: 07/18/2017 Negative  CBC Latest Ref Rng & Units 07/18/2017 05/16/2017 11/09/2016  WBC 3.8 - 10.8 K/uL 5.7 5.3 6.3  Hemoglobin 13.2 - 17.1 g/dL 16.1 09.6 04.5  Hematocrit 38.5 - 50.0 % 44.2 44.0 43.5  Platelets 140 - 400 K/uL 263 220 265   CMP Latest Ref Rng & Units 07/18/2017 05/16/2017 11/09/2016  Glucose 65 - 99 mg/dL 409(W) 97 119(J)  BUN 7 - 25 mg/dL 13 13 13   Creatinine 0.60 - 1.35 mg/dL 4.78 2.95 6.21  Sodium 135 - 146 mmol/L 136 135 138  Potassium 3.5 - 5.3 mmol/L 4.4 4.3 4.3  Chloride 98 - 110 mmol/L 102 102 104  CO2 20 - 32 mmol/L 20 23 24   Calcium 8.6 - 10.3 mg/dL 9.3 9.1 9.4  Total Protein 6.1 - 8.1 g/dL 7.0 6.9 7.2  Total Bilirubin 0.2 - 1.2 mg/dL 0.6 0.6 0.6  Alkaline Phos 40 - 115 U/L 96 86 90  AST 10 - 40 U/L 30 23 24   ALT 9 - 46 U/L 62(H) 46 44    Imaging: Xr Hand 2 View Left  Result Date: 10/17/2017 No MCP joint narrowing was noted. Mild PIP and DIP joint narrowing was noted. No intercarpal joint space narrowing was noted. No erosive changes were noted. X-ray of the hand is consistent with mild osteoarthritis.  Xr Hand 2 View Right  Result Date:  10/17/2017 No MCP joint narrowing was noted. Mild PIP and DIP joint narrowing was noted. No intercarpal joint space narrowing was noted. No erosive changes were noted. X-ray of the hand is consistent with mild osteoarthritis.  Xr Knee 3 View Left  Result Date: 10/17/2017 Moderate medial compartment narrowing intercondylar osteophytes and lateral osteophytes. No chondrocalcinosis was noted. Moderate to severe patellofemoral narrowing was noted. 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 The squaring of vertebrae  noted. Bambo spine noted. Possible T12 compression fracture noted. SI joints are completely fused. Severe ankylosing spondylitis. He has  Bamboo spine with syndesmophytes and squaring of her vertebrae. Possible T11 and T12 compression fractures. Facet joint arthropathy was noted.  Xr Shoulder Left  Result Date: 10/17/2017 No glenohumeral joint space narrowing was noted. With spurring  was noted. Type II acromion was noted.   Speciality Comments: No specialty comments available.    Procedures:  No procedures performed Allergies: Iohexol and Iodinated diagnostic agents   Assessment / Plan:     Visit Diagnoses: Ankylosing spondylitis of multiple sites in spine [facility](HCC) -patient continues to have some generalized pain and discomfort. I do not see any synovitis on examination. Plan: Sedimentation rate  High risk medication use - Enbrel 50 mg sq q wk, elevated LFts in 07/2017 probably related to NSAID use. - Plan: CBC with Differential/Platelet, COMPLETE METABOLIC PANEL WITH GFR today and then every 3 months.  DDD (degenerative disc disease), cervical/ AS cervical: He has limited range of motion of his C-spine.  Chronic midline low back pain without sciatica - He is on Flexeril 10 mg by mouth daily at bedtime and he has been referred to  pain clinic. - Plan: XR Lumbar Spine 2-3 Views. X-rays consistent with severe ankylosing spondylitis, fused SI joints, facet joint arthropathy. T11 and T12 vertebral fractures. I believe the vertebral fractures old as he is not in acute pain. I will schedule DEXA scan to evaluate bone mass density. Use of calcium and vitamin D was discussed.I will add vitamin D level to the lab work.  Chronic left shoulder pain - Plan: XR Shoulder Left  Tendinopathy of left shoulder  Pain in both hands - Plan: XR Hand 2 View Right, XR Hand 2 View Left  Pain in left elbow  Chronic pain of left knee - Plan: XR KNEE 3 VIEW LEFT  Neuropathy - He is on Neurontin  and Vit B6    Orders: Orders Placed This Encounter  Procedures  . XR Shoulder Left  . XR Hand 2 View Right  . XR Hand 2 View Left  . XR KNEE 3 VIEW LEFT  . XR Lumbar Spine 2-3 Views  . DG DXA FRACTURE ASSESSMENT  . CBC with Differential/Platelet  . COMPLETE METABOLIC PANEL WITH GFR  . Sedimentation rate  . VITAMIN D 25 Hydroxy (Vit-D Deficiency, Fractures)   No orders of the defined types were placed in this encounter.   Face-to-face time spent with patient was 30  minutes. Greater than 50% of time was spent in counseling and coordination of care.  Follow-Up Instructions: Return in about 5 months (around 03/17/2018) for Ankylosing spondylitis.   Pollyann SavoyShaili Bassy Fetterly, MD  Note - This record has been created using Animal nutritionistDragon software.  Chart creation errors have been sought, but may not always  have been located. Such creation errors do not reflect on  the standard of medical care.

## 2017-10-16 ENCOUNTER — Other Ambulatory Visit: Payer: Self-pay | Admitting: Rheumatology

## 2017-10-16 MED FILL — ENBREL 50 MG/ML SURECLICK S: 50 | 84 days supply | Qty: 12 | Fill #0

## 2017-10-16 NOTE — Telephone Encounter (Signed)
Last Visit: 05/17/17 Next Visit: 10/17/17 Labs: 07/18/17 Elevation of LFTs. advised him not to take any NSAIDs and avoid alcohol TB Gold: 07/18/17 Neg  Okay to refill per Dr. deveshwar 

## 2017-10-17 ENCOUNTER — Encounter: Payer: Self-pay | Admitting: Rheumatology

## 2017-10-17 ENCOUNTER — Ambulatory Visit (INDEPENDENT_AMBULATORY_CARE_PROVIDER_SITE_OTHER): Payer: Medicare Other

## 2017-10-17 ENCOUNTER — Encounter (INDEPENDENT_AMBULATORY_CARE_PROVIDER_SITE_OTHER): Payer: Self-pay

## 2017-10-17 ENCOUNTER — Ambulatory Visit (INDEPENDENT_AMBULATORY_CARE_PROVIDER_SITE_OTHER): Payer: Medicare Other | Admitting: Rheumatology

## 2017-10-17 ENCOUNTER — Ambulatory Visit (INDEPENDENT_AMBULATORY_CARE_PROVIDER_SITE_OTHER): Payer: Self-pay

## 2017-10-17 VITALS — BP 126/88 | HR 97 | Resp 15 | Ht 72.0 in | Wt 220.0 lb

## 2017-10-17 DIAGNOSIS — M79641 Pain in right hand: Secondary | ICD-10-CM | POA: Diagnosis not present

## 2017-10-17 DIAGNOSIS — M450 Ankylosing spondylitis of multiple sites in spine: Secondary | ICD-10-CM | POA: Diagnosis not present

## 2017-10-17 DIAGNOSIS — M79642 Pain in left hand: Secondary | ICD-10-CM

## 2017-10-17 DIAGNOSIS — G629 Polyneuropathy, unspecified: Secondary | ICD-10-CM

## 2017-10-17 DIAGNOSIS — M25512 Pain in left shoulder: Secondary | ICD-10-CM

## 2017-10-17 DIAGNOSIS — E559 Vitamin D deficiency, unspecified: Secondary | ICD-10-CM | POA: Diagnosis not present

## 2017-10-17 DIAGNOSIS — M25562 Pain in left knee: Secondary | ICD-10-CM | POA: Diagnosis not present

## 2017-10-17 DIAGNOSIS — G8929 Other chronic pain: Secondary | ICD-10-CM

## 2017-10-17 DIAGNOSIS — M545 Low back pain: Secondary | ICD-10-CM

## 2017-10-17 DIAGNOSIS — M8008XD Age-related osteoporosis with current pathological fracture, vertebra(e), subsequent encounter for fracture with routine healing: Secondary | ICD-10-CM

## 2017-10-17 DIAGNOSIS — M67912 Unspecified disorder of synovium and tendon, left shoulder: Secondary | ICD-10-CM | POA: Diagnosis not present

## 2017-10-17 DIAGNOSIS — Z79899 Other long term (current) drug therapy: Secondary | ICD-10-CM | POA: Diagnosis not present

## 2017-10-17 DIAGNOSIS — M5030 Other cervical disc degeneration, unspecified cervical region: Secondary | ICD-10-CM | POA: Diagnosis not present

## 2017-10-17 DIAGNOSIS — M25522 Pain in left elbow: Secondary | ICD-10-CM | POA: Diagnosis not present

## 2017-10-17 DIAGNOSIS — M8088XD Other osteoporosis with current pathological fracture, vertebra(e), subsequent encounter for fracture with routine healing: Secondary | ICD-10-CM

## 2017-10-17 LAB — COMPLETE METABOLIC PANEL WITH GFR
AG Ratio: 1.6 (calc) (ref 1.0–2.5)
ALT: 90 U/L — AB (ref 9–46)
AST: 47 U/L — AB (ref 10–40)
Albumin: 4.5 g/dL (ref 3.6–5.1)
Alkaline phosphatase (APISO): 89 U/L (ref 40–115)
BILIRUBIN TOTAL: 0.6 mg/dL (ref 0.2–1.2)
BUN: 16 mg/dL (ref 7–25)
CHLORIDE: 101 mmol/L (ref 98–110)
CO2: 27 mmol/L (ref 20–32)
Calcium: 9.7 mg/dL (ref 8.6–10.3)
Creat: 0.96 mg/dL (ref 0.60–1.35)
GFR, EST AFRICAN AMERICAN: 109 mL/min/{1.73_m2} (ref >=60)
GFR, Est Non African American: 94 mL/min/{1.73_m2} (ref >=60)
GLOBULIN: 2.9 g/dL (ref 1.9–3.7)
Glucose, Bld: 88 mg/dL (ref 65–99)
POTASSIUM: 4.9 mmol/L (ref 3.5–5.3)
SODIUM: 137 mmol/L (ref 135–146)
Total Protein: 7.4 g/dL (ref 6.1–8.1)

## 2017-10-17 LAB — CBC WITH DIFFERENTIAL/PLATELET
BASOS ABS: 48 {cells}/uL (ref 0–200)
Basophils Relative: 0.8 %
EOS ABS: 108 {cells}/uL (ref 15–500)
Eosinophils Relative: 1.8 %
HEMATOCRIT: 44.8 % (ref 38.5–50.0)
Hemoglobin: 15.7 g/dL (ref 13.2–17.1)
Lymphs Abs: 1446 cells/uL (ref 850–3900)
MCH: 33.1 pg — AB (ref 27.0–33.0)
MCHC: 35 g/dL (ref 32.0–36.0)
MCV: 94.5 fL (ref 80.0–100.0)
MPV: 10.3 fL (ref 7.5–12.5)
Monocytes Relative: 12.3 %
NEUTROS PCT: 61 %
Neutro Abs: 3660 cells/uL (ref 1500–7800)
PLATELETS: 242 10*3/uL (ref 140–400)
RBC: 4.74 10*6/uL (ref 4.20–5.80)
RDW: 11.6 % (ref 11.0–15.0)
TOTAL LYMPHOCYTE: 24.1 %
WBC: 6 10*3/uL (ref 3.8–10.8)
WBCMIX: 738 {cells}/uL (ref 200–950)

## 2017-10-17 LAB — SEDIMENTATION RATE: Sed Rate: 9 mm/h (ref 0–15)

## 2017-10-17 NOTE — Patient Instructions (Addendum)
Standing Labs We placed an order today for your standing lab work.    Please come back and get your standing labs in February and e Shoulder Exercises Ask your health care provider which exercises are safe for you. Do exercises exactly as told by your health care provider and adjust them as directed. It is normal to feel mild stretching, pulling, tightness, or discomfort as you do these exercises, but you should stop right away if you feel sudden pain or your pain gets worse.Do not begin these exercises until told by your health care provider. RANGE OF MOTION EXERCISES These exercises warm up your muscles and joints and improve the movement and flexibility of your shoulder. These exercises also help to relieve pain, numbness, and tingling. These exercises involve stretching your injured shoulder directly. Exercise A: Pendulum  1. Stand near a wall or a surface that you can hold onto for balance. 2. Bend at the waist and let your left / right arm hang straight down. Use your other arm to support you. Keep your back straight and do not lock your knees. 3. Relax your left / right arm and shoulder muscles, and move your hips and your trunk so your left / right arm swings freely. Your arm should swing because of the motion of your body, not because you are using your arm or shoulder muscles. 4. Keep moving your body so your arm swings in the following directions, as told by your health care provider: ? Side to side. ? Forward and backward. ? In clockwise and counterclockwise circles. 5. Continue each motion for __________ seconds, or for as long as told by your health care provider. 6. Slowly return to the starting position. Repeat __________ times. Complete this exercise __________ times a day. Exercise B:Flexion, Standing  1. Stand and hold a broomstick, a cane, or a similar object. Place your hands a little more than shoulder-width apart on the object. Your left / right hand should be palm-up,  and your other hand should be palm-down. 2. Keep your elbow straight and keep your shoulder muscles relaxed. Push the stick down with your healthy arm to raise your left / right arm in front of your body, and then over your head until you feel a stretch in your shoulder. ? Avoid shrugging your shoulder while you raise your arm. Keep your shoulder blade tucked down toward the middle of your back. 3. Hold for __________ seconds. 4. Slowly return to the starting position. Repeat __________ times. Complete this exercise __________ times a day. Exercise C: Abduction, Standing 1. Stand and hold a broomstick, a cane, or a similar object. Place your hands a little more than shoulder-width apart on the object. Your left / right hand should be palm-up, and your other hand should be palm-down. 2. While keeping your elbow straight and your shoulder muscles relaxed, push the stick across your body toward your left / right side. Raise your left / right arm to the side of your body and then over your head until you feel a stretch in your shoulder. ? Do not raise your arm above shoulder height, unless your health care provider tells you to do that. ? Avoid shrugging your shoulder while you raise your arm. Keep your shoulder blade tucked down toward the middle of your back. 3. Hold for __________ seconds. 4. Slowly return to the starting position. Repeat __________ times. Complete this exercise __________ times a day. Exercise D:Internal Rotation  1. Place your left / right hand  behind your back, palm-up. 2. Use your other hand to dangle an exercise band, a towel, or a similar object over your shoulder. Grasp the band with your left / right hand so you are holding onto both ends. 3. Gently pull up on the band until you feel a stretch in the front of your left / right shoulder. ? Avoid shrugging your shoulder while you raise your arm. Keep your shoulder blade tucked down toward the middle of your back. 4. Hold  for __________ seconds. 5. Release the stretch by letting go of the band and lowering your hands. Repeat __________ times. Complete this exercise __________ times a day. STRETCHING EXERCISES These exercises warm up your muscles and joints and improve the movement and flexibility of your shoulder. These exercises also help to relieve pain, numbness, and tingling. These exercises are done using your healthy shoulder to help stretch the muscles of your injured shoulder. Exercise E: Research officer, political partyCorner Stretch (External Rotation and Abduction)  1. Stand in a doorway with one of your feet slightly in front of the other. This is called a staggered stance. If you cannot reach your forearms to the door frame, stand facing a corner of a room. 2. Choose one of the following positions as told by your health care provider: ? Place your hands and forearms on the door frame above your head. ? Place your hands and forearms on the door frame at the height of your head. ? Place your hands on the door frame at the height of your elbows. 3. Slowly move your weight onto your front foot until you feel a stretch across your chest and in the front of your shoulders. Keep your head and chest upright and keep your abdominal muscles tight. 4. Hold for __________ seconds. 5. To release the stretch, shift your weight to your back foot. Repeat __________ times. Complete this stretch __________ times a day. Exercise F:Extension, Standing 1. Stand and hold a broomstick, a cane, or a similar object behind your back. ? Your hands should be a little wider than shoulder-width apart. ? Your palms should face away from your back. 2. Keeping your elbows straight and keeping your shoulder muscles relaxed, move the stick away from your body until you feel a stretch in your shoulder. ? Avoid shrugging your shoulders while you move the stick. Keep your shoulder blade tucked down toward the middle of your back. 3. Hold for __________  seconds. 4. Slowly return to the starting position. Repeat __________ times. Complete this exercise __________ times a day. STRENGTHENING EXERCISES These exercises build strength and endurance in your shoulder. Endurance is the ability to use your muscles for a long time, even after they get tired. Exercise G:External Rotation  1. Sit in a stable chair without armrests. 2. Secure an exercise band at elbow height on your left / right side. 3. Place a soft object, such as a folded towel or a small pillow, between your left / right upper arm and your body to move your elbow a few inches away (about 10 cm) from your side. 4. Hold the end of the band so it is tight and there is no slack. 5. Keeping your elbow pressed against the soft object, move your left / right forearm out, away from your abdomen. Keep your body steady so only your forearm moves. 6. Hold for __________ seconds. 7. Slowly return to the starting position. Repeat __________ times. Complete this exercise __________ times a day. Exercise H:Shoulder Abduction  1. Sit  in a stable chair without armrests, or stand. 2. Hold a __________ weight in your left / right hand, or hold an exercise band with both hands. 3. Start with your arms straight down and your left / right palm facing in, toward your body. 4. Slowly lift your left / right hand out to your side. Do not lift your hand above shoulder height unless your health care provider tells you that this is safe. ? Keep your arms straight. ? Avoid shrugging your shoulder while you do this movement. Keep your shoulder blade tucked down toward the middle of your back. 5. Hold for __________ seconds. 6. Slowly lower your arm, and return to the starting position. Repeat __________ times. Complete this exercise __________ times a day. Exercise I:Shoulder Extension 1. Sit in a stable chair without armrests, or stand. 2. Secure an exercise band to a stable object in front of you where it  is at shoulder height. 3. Hold one end of the exercise band in each hand. Your palms should face each other. 4. Straighten your elbows and lift your hands up to shoulder height. 5. Step back, away from the secured end of the exercise band, until the band is tight and there is no slack. 6. Squeeze your shoulder blades together as you pull your hands down to the sides of your thighs. Stop when your hands are straight down by your sides. Do not let your hands go behind your body. 7. Hold for __________ seconds. 8. Slowly return to the starting position. Repeat __________ times. Complete this exercise __________ times a day. Exercise J:Standing Shoulder Row 1. Sit in a stable chair without armrests, or stand. 2. Secure an exercise band to a stable object in front of you so it is at waist height. 3. Hold one end of the exercise band in each hand. Your palms should be in a thumbs-up position. 4. Bend each of your elbows to an "L" shape (about 90 degrees) and keep your upper arms at your sides. 5. Step back until the band is tight and there is no slack. 6. Slowly pull your elbows back behind you. 7. Hold for __________ seconds. 8. Slowly return to the starting position. Repeat __________ times. Complete this exercise __________ times a day. Exercise K:Shoulder Press-Ups  1. Sit in a stable chair that has armrests. Sit upright, with your feet flat on the floor. 2. Put your hands on the armrests so your elbows are bent and your fingers are pointing forward. Your hands should be about even with the sides of your body. 3. Push down on the armrests and use your arms to lift yourself off of the chair. Straighten your elbows and lift yourself up as much as you comfortably can. ? Move your shoulder blades down, and avoid letting your shoulders move up toward your ears. ? Keep your feet on the ground. As you get stronger, your feet should support less of your body weight as you lift yourself up. 4. Hold  for __________ seconds. 5. Slowly lower yourself back into the chair. Repeat __________ times. Complete this exercise __________ times a day. Exercise L: Wall Push-Ups  1. Stand so you are facing a stable wall. Your feet should be about one arm-length away from the wall. 2. Lean forward and place your palms on the wall at shoulder height. 3. Keep your feet flat on the floor as you bend your elbows and lean forward toward the wall. 4. Hold for __________ seconds. 5. Straighten your  elbows to push yourself back to the starting position. Repeat __________ times. Complete this exercise __________ times a day. This information is not intended to replace advice given to you by your health care provider. Make sure you discuss any questions you have with your health care provider. Document Released: 10/04/2005 Document Revised: 08/14/2016 Document Reviewed: 08/01/2015 Elsevier Interactive Patient Education  [employer]2018 [employer] very 3 months  We have open lab Monday through Friday from 8:30-11:30 AM and 1:30-4 PM at the office of Dr. Pollyann SavoyShaili Tyja Gortney.   The office is located at [address], [address], [place], [place] [address] No appointment is necessary.   Labs are drawn by [facility].  You may receive a bill from [facility] for your lab work. If you have any questions regarding directions or hours of operation,  please call [phone].

## 2017-10-18 NOTE — Progress Notes (Signed)
LFTs are higher. He should dc NSAIDS

## 2017-10-29 NOTE — Progress Notes (Signed)
He was referred to the pain clinic.

## 2017-10-30 ENCOUNTER — Telehealth: Payer: Self-pay | Admitting: *Deleted

## 2017-10-30 NOTE — Telephone Encounter (Signed)
Please have patient come in for UDS and narcotic agreement signature. We can give him hydrocodone 5/325 mg by mouth daily at bedtime when necessary total 30 with no refills.

## 2017-10-30 NOTE — Telephone Encounter (Signed)
Patient is scheduled for an appt 10/31/17 at 1:00 pm w/Dr. Corliss Skainseveshwar. Patient will not be given hydrocodone rx until PCP appt and appt w/Dr. Corliss Skainseveshwar have been completed.

## 2017-10-30 NOTE — Telephone Encounter (Signed)
I called patient. Patient stated he has an appt w/PCP on 10/31/17 at 11:30 to recheck his labs. Patient has been scheduled at 1:00 10/31/17 w/Dr. Corliss Skainseveshwar to review his medications and labs. Dr. Corliss Skainseveshwar has agreed to prescribe hydrocodone #30 after urine drug screen during office visit.

## 2017-10-30 NOTE — Progress Notes (Signed)
Office Visit Note  Patient: Benjamin Wiggins             Date of Birth: 1971-10-25           MRN: [id number]             PCP: Cheron Schaumann., MD Referring: Cheron Schaumann.,* Visit Date: 10/31/2017 Occupation: @GUAROCC @    Subjective:  Medication Problem (Discuss labs, medication and pain)   History of Present Illness: Benjamin Wiggins is a 46 y.o. male  He states after the phone call he received from the office to stop his medications he stopped Enbrel and anti-inflammatories. He's been having increased pain off  Enbrel. His last injection of Enbrel was 1-1/2 weeks ago. He states the pain is all over his body. He denies any joint swelling. He is been having some discomfort in his left knee.He continues to have lower back discomfort.  Activities of Daily Living:  Patient reports morning stiffness for 1 hour.   Patient REPORTS nocturnal pain.  Difficulty dressing/grooming: Reports Difficulty climbing stairs: Reports Difficulty getting out of chair: Reports Difficulty using hands for taps, buttons, cutlery, and/or writing: Reports   Review of Systems  Constitutional: Negative for activity change, fatigue, night sweats and weakness ( ).  HENT: Positive for mouth dryness. Negative for mouth sores and nose dryness.   Eyes: Positive for dryness. Negative for redness.  Respiratory: Negative for shortness of breath and difficulty breathing.   Cardiovascular: Negative for chest pain, palpitations, hypertension, irregular heartbeat and swelling in legs/feet.  Gastrointestinal: Positive for constipation, diarrhea and heartburn.  Endocrine: Negative for excessive thirst and increased urination.  Genitourinary: Negative for painful urination.  Musculoskeletal: Positive for arthralgias, joint pain, muscle weakness, morning stiffness and muscle tenderness. Negative for gait problem, joint swelling, myalgias and myalgias.  Skin: Negative for color change, rash, hair loss,  nodules/bumps, skin tightness, ulcers and sensitivity to sunlight.  Allergic/Immunologic: Negative for susceptible to infections.  Neurological: Negative for dizziness, fainting, light-headedness, numbness, headaches, memory loss and night sweats.  Hematological: Negative for bruising/bleeding tendency and swollen glands.  Psychiatric/Behavioral: Positive for sleep disturbance. Negative for depressed mood. The patient is not nervous/anxious.     PMFS History:  Patient Active Problem List   Diagnosis Date Noted  . Ankylosing spondylitis (HCC) 11/07/2016  . High risk medication use 11/07/2016  . Neuropathy 02/23/2016  . Numbness 02/23/2016  . Facial numbness 02/23/2016  . Gait disorder 02/23/2016  . Memory changes 02/23/2016    Past Medical History:  Diagnosis Date  . Ankylosing spondylitis (HCC)   . Neuropathy     Family History  Problem Relation Age of Onset  . Diabetes Father   . Heart disease Father   . Neuropathy Neg Hx    Past Surgical History:  Procedure Laterality Date  . ANKLE SURGERY     titatium plate   Social History   Social History Narrative   Lives with [name]    Caffeine use: 3-4 cups per day     Objective: Vital Signs: BP (!) 155/85 (BP Location: Left Arm, Patient Position: Sitting, Cuff Size: Normal)   Pulse (!) 101   Resp 16   Ht 6' (1.829 m)   Wt 220 lb (99.8 kg)   BMI 29.84 kg/m    Physical Exam  Constitutional: He is oriented to person, place, and time. He appears well-developed and well-nourished.  HENT:  Head: Normocephalic and atraumatic.  Eyes: Conjunctivae and EOM are normal. Pupils are equal,  round, and reactive to light.  Neck: Normal range of motion. Neck supple.  Cardiovascular: Normal rate, regular rhythm and normal heart sounds.  Pulmonary/Chest: Effort normal and breath sounds normal.  Abdominal: Soft. Bowel sounds are normal.  Neurological: He is alert and oriented to person, place, and time.  Skin: Skin is warm and  dry. Capillary refill takes less than 2 seconds.  Psychiatric: He has a normal mood and affect. His behavior is normal.  Nursing note and vitals reviewed.    Musculoskeletal Exam:  C-spine and thoracic lumbar spine very limited range of motion with some discomfort in the lower thoracic region. Shoulder joints, elbow joints, wrist joints, MCPs PIPs with good range of motion with no synovitis. Hip joints knee joints ankles MTPs PIPs DIPs are good range of motion with no synovitis. Hip joints, knee joints, ankles and MTPs PIPs with good range of motion with no synovitis.  CDAI Exam: CDAI Homunculus Exam:   Tenderness:  LLE: tibiofemoral  Joint Counts:  CDAI Tender Joint count: 1 CDAI Swollen Joint count: 0  Global Assessments:  Patient Global Assessment: 10 Provider Global Assessment: 5  CDAI Calculated Score: 16    Investigation: No additional findings.TB Gold: 07/18/2017 Negative  CBC Latest Ref Rng & Units 10/17/2017 07/18/2017 05/16/2017  WBC 3.8 - 10.8 Thousand/uL 6.0 5.7 5.3  Hemoglobin 13.2 - 17.1 g/dL 40.915.7 81.115.1 91.415.0  Hematocrit 38.5 - 50.0 % 44.8 44.2 44.0  Platelets 140 - 400 Thousand/uL 242 263 220   CMP Latest Ref Rng & Units 10/17/2017 07/18/2017 05/16/2017  Glucose 65 - 99 mg/dL 88 782(N108(H) 97  BUN 7 - 25 mg/dL 16 13 13   Creatinine 0.60 - 1.35 mg/dL 5.620.96 1.300.86 8.650.82  Sodium 135 - 146 mmol/L 137 136 135  Potassium 3.5 - 5.3 mmol/L 4.9 4.4 4.3  Chloride 98 - 110 mmol/L 101 102 102  CO2 20 - 32 mmol/L 27 20 23   Calcium 8.6 - 10.3 mg/dL 9.7 9.3 9.1  Total Protein 6.1 - 8.1 g/dL 7.4 7.0 6.9  Total Bilirubin 0.2 - 1.2 mg/dL 0.6 0.6 0.6  Alkaline Phos 40 - 115 U/L - 96 86  AST 10 - 40 U/L 47(H) 30 23  ALT 9 - 46 U/L 90(H) 62(H) 46    Imaging: Xr Hand 2 View Left  Result Date: 10/17/2017 No MCP joint narrowing was noted. Mild PIP and DIP joint narrowing was noted. No intercarpal joint space narrowing was noted. No erosive changes were noted. X-ray of the hand is  consistent with mild osteoarthritis.  Xr Hand 2 View Right  Result Date: 10/17/2017 No MCP joint narrowing was noted. Mild PIP and DIP joint narrowing was noted. No intercarpal joint space narrowing was noted. No erosive changes were noted. X-ray of the hand is consistent with mild osteoarthritis.  Xr Knee 3 View Left  Result Date: 10/17/2017 Moderate medial compartment narrowing intercondylar osteophytes and lateral osteophytes. No chondrocalcinosis was noted. Moderate to severe patellofemoral narrowing was noted. 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 The squaring of vertebrae  noted. Bambo spine noted. Possible T12 compression fracture noted. SI joints are completely fused. Severe ankylosing spondylitis. He has  Bamboo spine with syndesmophytes and squaring of her vertebrae. Possible T11 and T12 compression fractures. Facet joint arthropathy was noted.  Xr Shoulder Left  Result Date: 10/17/2017 No glenohumeral joint space narrowing was noted. With spurring was  noted. Type II acromion was noted.   Speciality Comments: No specialty comments available.    Procedures:  No procedures performed Allergies: Iohexol; Iodinated diagnostic agents; and Tramadol   Assessment / Plan:     Visit Diagnoses: Ankylosing spondylitis of multiple sites in spine [facility](HCC): He has ankylosing spondylitis involving cervical thoracic and lumbar spine with limitation of range of motion. He stopped Enbrel after the last phone call and is experiencing increased pain off Enbrel. He's been advised to resume Enbrel.  High risk medication use - Enbrel 50 mg sq q wk, elevated LFts in 07/2017 and 10/17/2017 probably related to NSAID use. She was advised not to take anti-inflammatories anymore. I've also advised her to avoid Tylenol. He is concerned about the pain and  discomfort she's been experiencing. At advised to take tramadol. He does have an old prescription of tramadol which she has never used.  DDD (degenerative disc disease), cervical - : He has limited range of motion of his C-spine.  Chronic midline low back pain without sciatica - He  Is awaiting appointment from pain clinic.I also discussed with him use of Cymbalta. He is currently on Zoloft by his PCP. Cymbalta may be a better option. I've also advised him to discontinue Flexeril and have switched him to tizanidine 4 mg by mouth daily at bedtime when necessary. Side effects were reviewed. Total 30 tablets with 2 refills were given.  Non-traumatic compression fracture of twelfth thoracic vertebra, sequela: He is not having any acute discomfort in that region. I did offer MRI to evaluate if the fractures were acute or chronic. He would like to wait at this time due to cost of MRI. His DEXA scan is pending at this time. He will also get vitamin D level drawn at his PCPs office tomorrow.  Neuropathy - He is on Neurontin  and Vit B6   Tendinopathy of left shoulder    Orders: No orders of the defined types were placed in this encounter.  Meds ordered this encounter  Medications  . tiZANidine (ZANAFLEX) 4 MG tablet    Sig: Take 1 tablet (4 mg total) by mouth at bedtime.    Dispense:  30 tablet    Refill:  3    Face-to-face time spent with patient was 30 minutes.  Greater than 50% of time was spent in counseling and coordination of care.  Follow-Up Instructions: Return for Ankylosing spondylitis.   Pollyann SavoyShaili Amiera Herzberg, MD  Note - This record has been created using Animal nutritionistDragon software.  Chart creation errors have been sought, but may not always  have been located. Such creation errors do not reflect on  the standard of medical care.

## 2017-10-30 NOTE — Telephone Encounter (Signed)
Patient very upset. Patient stated he had a panic attack 10/24/17 due to pain. Sue Lushndrea spoke to patient 10/24/17 regarding abnormal labs. Patient advised to take Tylenol for pain. Patient stated Tramadol gives him a hive reaction. What should he do for the pain? Please advise. Thank you.

## 2017-10-31 ENCOUNTER — Encounter: Payer: Self-pay | Admitting: Rheumatology

## 2017-10-31 ENCOUNTER — Ambulatory Visit (INDEPENDENT_AMBULATORY_CARE_PROVIDER_SITE_OTHER): Payer: Medicare Other | Admitting: Rheumatology

## 2017-10-31 VITALS — BP 155/85 | HR 101 | Resp 16 | Ht 72.0 in | Wt 220.0 lb

## 2017-10-31 DIAGNOSIS — G8929 Other chronic pain: Secondary | ICD-10-CM

## 2017-10-31 DIAGNOSIS — Z79899 Other long term (current) drug therapy: Secondary | ICD-10-CM | POA: Diagnosis not present

## 2017-10-31 DIAGNOSIS — M545 Low back pain: Secondary | ICD-10-CM

## 2017-10-31 DIAGNOSIS — M5030 Other cervical disc degeneration, unspecified cervical region: Secondary | ICD-10-CM | POA: Diagnosis not present

## 2017-10-31 DIAGNOSIS — M4854XS Collapsed vertebra, not elsewhere classified, thoracic region, sequela of fracture: Secondary | ICD-10-CM | POA: Diagnosis not present

## 2017-10-31 DIAGNOSIS — G629 Polyneuropathy, unspecified: Secondary | ICD-10-CM

## 2017-10-31 DIAGNOSIS — M67912 Unspecified disorder of synovium and tendon, left shoulder: Secondary | ICD-10-CM

## 2017-10-31 DIAGNOSIS — M450 Ankylosing spondylitis of multiple sites in spine: Secondary | ICD-10-CM

## 2017-10-31 MED ORDER — TIZANIDINE HCL 4 MG PO TABS: 4.0000 mg | ORAL_TABLET | Freq: Every day | ORAL | 3 refills | Status: DC

## 2017-10-31 NOTE — Progress Notes (Signed)
Updated patient's medication list. Patient no longer taking any vitamins or other homeopathic remedies. Patient recently stopped taking NSAIDs (naproxen 4 tablets daily and dicolefenac 75mg  BID- stated at last appt that he takes TID) due to recently elevated ALTs and ASTs. Patient asked about the benefit of CBD oil for pain/inflammation. States he tried CBD oil and felt it did not help for pain, but he did "feel mellow". Advised patient that I do not have an official professional advice on CBD oil, I am aware of case reports of benefit and case reports of harm, but I do not recommend CBD oil professionally to any patient. Asked patient to clarify if he wanted to start using CBD oil, and patient states he does not because when he tried it, it did not help with his pain.   Essie HartJulie E Saunders, PharmD

## 2017-12-12 ENCOUNTER — Encounter: Payer: Medicare Other | Attending: Physical Medicine & Rehabilitation | Admitting: Physical Medicine & Rehabilitation

## 2017-12-12 DIAGNOSIS — M25561 Pain in right knee: Secondary | ICD-10-CM | POA: Insufficient documentation

## 2017-12-12 DIAGNOSIS — M25562 Pain in left knee: Secondary | ICD-10-CM | POA: Insufficient documentation

## 2017-12-12 DIAGNOSIS — M25512 Pain in left shoulder: Secondary | ICD-10-CM | POA: Insufficient documentation

## 2017-12-12 DIAGNOSIS — M25511 Pain in right shoulder: Secondary | ICD-10-CM | POA: Insufficient documentation

## 2017-12-12 DIAGNOSIS — M450 Ankylosing spondylitis of multiple sites in spine: Secondary | ICD-10-CM | POA: Insufficient documentation

## 2017-12-12 DIAGNOSIS — M542 Cervicalgia: Secondary | ICD-10-CM | POA: Insufficient documentation

## 2017-12-12 DIAGNOSIS — G8929 Other chronic pain: Secondary | ICD-10-CM | POA: Insufficient documentation

## 2017-12-20 ENCOUNTER — Other Ambulatory Visit: Payer: Self-pay

## 2017-12-20 ENCOUNTER — Encounter: Payer: Self-pay | Admitting: Physical Medicine & Rehabilitation

## 2017-12-20 ENCOUNTER — Encounter (HOSPITAL_BASED_OUTPATIENT_CLINIC_OR_DEPARTMENT_OTHER): Payer: Medicare Other | Admitting: Physical Medicine & Rehabilitation

## 2017-12-20 VITALS — BP 123/87 | HR 87

## 2017-12-20 DIAGNOSIS — M791 Myalgia, unspecified site: Secondary | ICD-10-CM

## 2017-12-20 DIAGNOSIS — M25562 Pain in left knee: Secondary | ICD-10-CM | POA: Diagnosis not present

## 2017-12-20 DIAGNOSIS — M25512 Pain in left shoulder: Secondary | ICD-10-CM | POA: Diagnosis not present

## 2017-12-20 DIAGNOSIS — M450 Ankylosing spondylitis of multiple sites in spine: Secondary | ICD-10-CM

## 2017-12-20 DIAGNOSIS — G479 Sleep disorder, unspecified: Secondary | ICD-10-CM | POA: Diagnosis not present

## 2017-12-20 DIAGNOSIS — G8929 Other chronic pain: Secondary | ICD-10-CM | POA: Diagnosis not present

## 2017-12-20 DIAGNOSIS — M25511 Pain in right shoulder: Secondary | ICD-10-CM | POA: Diagnosis present

## 2017-12-20 DIAGNOSIS — R269 Unspecified abnormalities of gait and mobility: Secondary | ICD-10-CM

## 2017-12-20 DIAGNOSIS — M25561 Pain in right knee: Secondary | ICD-10-CM | POA: Diagnosis not present

## 2017-12-20 DIAGNOSIS — M542 Cervicalgia: Secondary | ICD-10-CM | POA: Diagnosis present

## 2017-12-20 MED ORDER — AMITRIPTYLINE HCL 10 MG PO TABS: 10.0000 mg | ORAL_TABLET | Freq: Every day | ORAL | 1 refills | Status: DC

## 2017-12-20 NOTE — Progress Notes (Signed)
Subjective:    Patient ID: Benjamin Wiggins, male    DOB: Jul 27, 1971, 47 y.o.   MRN: [id number]  HPI 47 y/o male with pmh of severe ankylosing spondylitis and neuropathy, compression fracture of 2 vertebra, left should bursitis presents for evaluation for AS.  Diagnoses in ~2002. Getting progressively worse. Heat and pool therapy improve the pain. Cold exacerbates the pain. Varying degrees of pain. Constant.  Activity exacerbates the pain.  Intermittent radiation.  Is not sure if Cymbalta helps.  Tizanidine helps.  Tramadol helps little. Hemp oil does not help.  Associated numbness numbness/tingling in legs.  Left leg "gives out" resulting in falls. Pt would like to return to activities he enjoys.   Of note pt was previously seeing [facility], but showed benzos in urine and Oxy discontinued.   Pain Inventory Average Pain 10 Pain Right Now 9 My pain is sharp  In the last 24 hours, has pain interfered with the following? General activity 10 Relation with others 10 Enjoyment of life 8 What TIME of day is your pain at its worst? all Sleep (in general) Poor  Pain is worse with: . Pain improves with: . Relief from Meds: 2  Mobility walk without assistance ability to climb steps?  yes do you drive?  yes Do you have any goals in this area?  yes  Function disabled: date disabled . I need assistance with the following:  dressing, household duties and shopping  Neuro/Psych trouble walking spasms anxiety  Prior Studies Any changes since last visit?  no  Physicians involved in your care Any changes since last visit?  no   Family History  Problem Relation Age of Onset  . Diabetes Father   . Heart disease Father   . Neuropathy Neg Hx    Social History   Socioeconomic History  . Marital status: Single    Spouse name: Angelique Blonder  . Number of children: 1  . Years of education: 25  . Highest education level: None  Social Needs  . Financial resource strain: None  . Food  insecurity - worry: None  . Food insecurity - inability: None  . Transportation needs - medical: None  . Transportation needs - non-medical: None  Occupational History  . Occupation: Unemployed- Disability   Tobacco Use  . Smoking status: Former Smoker    Packs/day: 0.10    Years: 3.00    Pack years: 0.30    Types: Cigarettes    Last attempt to quit: 1993    Years since quitting: 26.0  . Smokeless tobacco: Never Used  Substance and Sexual Activity  . Alcohol use: Yes    Alcohol/week: 0.6 oz    Types: 1 Glasses of wine per week    Comment: occ  . Drug use: No  . Sexual activity: None  Other Topics Concern  . None  Social History Narrative   Lives with [name]    Caffeine use: 3-4 cups per day   Past Surgical History:  Procedure Laterality Date  . ANKLE SURGERY     titatium plate   Past Medical History:  Diagnosis Date  . Ankylosing spondylitis (HCC)   . Neuropathy    BP 123/87   Pulse 87   SpO2 97%   Opioid Risk Score:   Fall Risk Score:  `1  Depression screen PHQ 2/9  Depression screen [facility] 2/9 12/20/2017 12/20/2017  Decreased Interest 3 0  Down, Depressed, Hopeless 2 0  PHQ - 2 Score 5 0  Altered sleeping 3 -  Tired, decreased energy 2 -  Change in appetite 1 -  Feeling bad or failure about yourself  2 -  Trouble concentrating 0 -  Moving slowly or fidgety/restless 0 -  Suicidal thoughts 0 -  PHQ-9 Score 13 -  Difficult doing work/chores Somewhat difficult -     Review of Systems  Constitutional: Positive for unexpected weight change.  HENT: Negative.   Eyes: Negative.   Respiratory:       Respiratory infections  Cardiovascular: Negative.   Gastrointestinal: Negative.   Endocrine: Negative.   Genitourinary: Negative.   Musculoskeletal: Positive for arthralgias, back pain, gait problem, myalgias, neck pain and neck stiffness.  Skin: Negative.   Allergic/Immunologic: Negative.   Hematological: Negative.   Psychiatric/Behavioral: Negative.    All other systems reviewed and are negative.     Objective:   Physical Exam Gen: NAD. Vital signs reviewed HENT: Normocephalic, Atraumatic Eyes: EOMI. No discharge.  Cardio: RRR. No JVD. Pulm: B/l clear to auscultation.  Effort normal Abd: Soft, BS+ MSK:  Gait antalgic.   TTP lumbosacral PSPs.    No edema.   Limited ROM in lumbar spine Neuro: CN II-XII grossly intact.    Sensation intact to light touch in all LE dermatomes  Strength  4-4+/5 in all LE myotomes Skin: Warm and Dry. Intact    Assessment & Plan:  47 y/o male with pmh of severe ankylosing spondylitis and neuropathy, compression fracture of 2 vertebra, left should bursitis presents for evaluation for AS   1. 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 Gait abnormality  Will order cane  3. Sleep disturbance  Will order Elavil 10mg , education on signs and symptoms of serotonin syndrome  4. Myalgia   Will consider trigger point injections

## 2018-01-08 ENCOUNTER — Other Ambulatory Visit: Payer: Self-pay | Admitting: Rheumatology

## 2018-01-08 MED FILL — ENBREL 50 MG/ML SURECLICK S: 50 | 84 days supply | Qty: 12 | Fill #0

## 2018-01-08 NOTE — Telephone Encounter (Signed)
Last visit: 10/31/2017 Next visit: 03/21/2018 Labs: 10/17/2017 TB Gold: 07/18/2017 Negative   Advised patient he is due for labs. Patient states he will come in tomorrow to have labs drawn.   Okay to refill per Dr. Corliss Skainseveshwar.

## 2018-01-16 ENCOUNTER — Telehealth: Payer: Self-pay | Admitting: Physical Medicine & Rehabilitation

## 2018-01-16 NOTE — Telephone Encounter (Signed)
Follow up with Ishmael HolterSarah Clark. Thanks.

## 2018-01-16 NOTE — Telephone Encounter (Signed)
°  Dr Allena KatzPatel patient states no one ever called him about TENS unit

## 2018-01-17 ENCOUNTER — Encounter: Payer: Medicare Other | Admitting: Physical Medicine & Rehabilitation

## 2018-01-17 NOTE — Telephone Encounter (Signed)
Contacted Robynn PaneSara Clark, [facility], and left a voicemail asking her to please contact patient regarding status of request for TENS unit. I faxed over another script along with demographics

## 2018-01-23 ENCOUNTER — Encounter: Payer: Medicare Other | Attending: Physical Medicine & Rehabilitation | Admitting: Physical Medicine & Rehabilitation

## 2018-01-23 ENCOUNTER — Encounter: Payer: Self-pay | Admitting: Physical Medicine & Rehabilitation

## 2018-01-23 ENCOUNTER — Other Ambulatory Visit: Payer: Self-pay | Admitting: *Deleted

## 2018-01-23 VITALS — BP 122/77 | HR 86

## 2018-01-23 DIAGNOSIS — G8929 Other chronic pain: Secondary | ICD-10-CM | POA: Diagnosis not present

## 2018-01-23 DIAGNOSIS — M791 Myalgia, unspecified site: Secondary | ICD-10-CM

## 2018-01-23 DIAGNOSIS — M25561 Pain in right knee: Secondary | ICD-10-CM | POA: Diagnosis not present

## 2018-01-23 DIAGNOSIS — M25512 Pain in left shoulder: Secondary | ICD-10-CM | POA: Diagnosis not present

## 2018-01-23 DIAGNOSIS — M450 Ankylosing spondylitis of multiple sites in spine: Secondary | ICD-10-CM | POA: Insufficient documentation

## 2018-01-23 DIAGNOSIS — M542 Cervicalgia: Secondary | ICD-10-CM | POA: Insufficient documentation

## 2018-01-23 DIAGNOSIS — Z79899 Other long term (current) drug therapy: Secondary | ICD-10-CM

## 2018-01-23 DIAGNOSIS — M25511 Pain in right shoulder: Secondary | ICD-10-CM | POA: Insufficient documentation

## 2018-01-23 DIAGNOSIS — R269 Unspecified abnormalities of gait and mobility: Secondary | ICD-10-CM

## 2018-01-23 DIAGNOSIS — G479 Sleep disorder, unspecified: Secondary | ICD-10-CM

## 2018-01-23 DIAGNOSIS — M25562 Pain in left knee: Secondary | ICD-10-CM | POA: Diagnosis not present

## 2018-01-23 LAB — COMPLETE METABOLIC PANEL WITH GFR
AG RATIO: 1.5 (calc) (ref 1.0–2.5)
ALKALINE PHOSPHATASE (APISO): 82 U/L (ref 40–115)
ALT: 58 U/L — AB (ref 9–46)
AST: 30 U/L (ref 10–40)
Albumin: 4.4 g/dL (ref 3.6–5.1)
BILIRUBIN TOTAL: 0.7 mg/dL (ref 0.2–1.2)
BUN: 16 mg/dL (ref 7–25)
CALCIUM: 9.4 mg/dL (ref 8.6–10.3)
CHLORIDE: 105 mmol/L (ref 98–110)
CO2: 24 mmol/L (ref 20–32)
Creat: 0.85 mg/dL (ref 0.60–1.35)
GFR, Est African American: 120 mL/min/{1.73_m2} (ref >=60)
GFR, Est Non African American: 104 mL/min/{1.73_m2} (ref >=60)
GLOBULIN: 3 g/dL (ref 1.9–3.7)
Glucose, Bld: 100 mg/dL — ABNORMAL HIGH (ref 65–99)
Potassium: 4.6 mmol/L (ref 3.5–5.3)
SODIUM: 137 mmol/L (ref 135–146)
Total Protein: 7.4 g/dL (ref 6.1–8.1)

## 2018-01-23 LAB — CBC WITH DIFFERENTIAL/PLATELET
BASOS PCT: 1.4 %
Basophils Absolute: 87 cells/uL (ref 0–200)
EOS ABS: 198 {cells}/uL (ref 15–500)
EOS PCT: 3.2 %
HCT: 42.5 % (ref 38.5–50.0)
HEMOGLOBIN: 14.6 g/dL (ref 13.2–17.1)
Lymphs Abs: 2257 cells/uL (ref 850–3900)
MCH: 32.4 pg (ref 27.0–33.0)
MCHC: 34.4 g/dL (ref 32.0–36.0)
MCV: 94.2 fL (ref 80.0–100.0)
MONOS PCT: 11.2 %
MPV: 10.2 fL (ref 7.5–12.5)
NEUTROS ABS: 2964 {cells}/uL (ref 1500–7800)
Neutrophils Relative %: 47.8 %
PLATELETS: 274 10*3/uL (ref 140–400)
RBC: 4.51 10*6/uL (ref 4.20–5.80)
RDW: 11.7 % (ref 11.0–15.0)
TOTAL LYMPHOCYTE: 36.4 %
WBC mixed population: 694 cells/uL (ref 200–950)
WBC: 6.2 10*3/uL (ref 3.8–10.8)

## 2018-01-23 MED ORDER — AMITRIPTYLINE HCL 25 MG PO TABS: 25.0000 mg | ORAL_TABLET | Freq: Every day | ORAL | 1 refills | Status: DC

## 2018-01-23 MED ORDER — MELOXICAM 15 MG PO TABS: 15.0000 mg | ORAL_TABLET | Freq: Every day | ORAL | 1 refills | Status: DC

## 2018-01-23 NOTE — Progress Notes (Signed)
Subjective:    Patient ID: Benjamin Wiggins, male    DOB: 1971/06/03, 47 y.o.   MRN: [id number]  HPI 47 y/o male with pmh of severe ankylosing spondylitis and neuropathy, compression fracture of 2 vertebra, left should bursitis presents for follow up for AS.  Initially stated: Diagnoses in ~2002. Getting progressively worse. Heat and pool therapy improve the pain. Cold exacerbates the pain. Varying degrees of pain. Constant.  Activity exacerbates the pain.  Intermittent radiation.  Is not sure if Cymbalta helps.  Tizanidine helps.  Tramadol helps little. Hemp oil does not help.  Associated numbness numbness/tingling in legs.  Left leg "gives out" resulting in falls. Pt would like to return to activities he enjoys.   Of note pt was previously seeing [facility], but showed benzos in urine and Oxy discontinued.   Last clinic visit 12/20/17.  Since that time, pt states he never received call back about TENS unit.  He never Lidoderm patches OTC.  He states he already had a cane.  He is taking Elavil with some benefit.   Pain Inventory Average Pain 10 Pain Right Now 10 My pain is sharp  In the last 24 hours, has pain interfered with the following? General activity 10 Relation with others 10 Enjoyment of life 10 What TIME of day is your pain at its worst? all Sleep (in general) Poor  Pain is worse with: walking, bending, sitting, inactivity and unsure Pain improves with: . Relief from Meds: 2  Mobility walk without assistance ability to climb steps?  yes do you drive?  yes Do you have any goals in this area?  yes  Function disabled: date disabled . I need assistance with the following:  dressing, household duties and shopping  Neuro/Psych trouble walking spasms anxiety  Prior Studies Any changes since last visit?  no  Physicians involved in your care Any changes since last visit?  no   Family History  Problem Relation Age of Onset  . Diabetes Father   . Heart disease  Father   . Neuropathy Neg Hx    Social History   Socioeconomic History  . Marital status: Single    Spouse name: Benjamin Wiggins  . Number of children: 1  . Years of education: 2214  . Highest education level: None  Social Needs  . Financial resource strain: None  . Food insecurity - worry: None  . Food insecurity - inability: None  . Transportation needs - medical: None  . Transportation needs - non-medical: None  Occupational History  . Occupation: Unemployed- Disability   Tobacco Use  . Smoking status: Former Smoker    Packs/day: 0.10    Years: 3.00    Pack years: 0.30    Types: Cigarettes    Last attempt to quit: 1993    Years since quitting: 26.1  . Smokeless tobacco: Never Used  Substance and Sexual Activity  . Alcohol use: Yes    Alcohol/week: 0.6 oz    Types: 1 Glasses of wine per week    Comment: occ  . Drug use: No  . Sexual activity: None  Other Topics Concern  . None  Social History Narrative   Lives with Benjamin Wiggins    Caffeine use: 3-4 cups per day   Past Surgical History:  Procedure Laterality Date  . ANKLE SURGERY     titatium plate   Past Medical History:  Diagnosis Date  . Ankylosing spondylitis (HCC)   . Neuropathy    BP 122/77  Pulse 86   SpO2 97%   Opioid Risk Score:   Fall Risk Score:  `1  Depression screen PHQ 2/9  Depression screen [facility] 2/9 12/20/2017 12/20/2017  Decreased Interest 3 0  Down, Depressed, Hopeless 2 0  PHQ - 2 Score 5 0  Altered sleeping 3 -  Tired, decreased energy 2 -  Change in appetite 1 -  Feeling bad or failure about yourself  2 -  Trouble concentrating 0 -  Moving slowly or fidgety/restless 0 -  Suicidal thoughts 0 -  PHQ-9 Score 13 -  Difficult doing work/chores Somewhat difficult -     Review of Systems  Constitutional: Negative.   HENT: Negative.   Eyes: Negative.   Respiratory:       Respiratory infections  Cardiovascular: Negative.   Gastrointestinal: Negative.   Endocrine: Negative.     Genitourinary: Negative.   Musculoskeletal: Positive for arthralgias, back pain, gait problem, myalgias, neck pain and neck stiffness.  Skin: Negative.   Allergic/Immunologic: Negative.   Hematological: Negative.   Psychiatric/Behavioral: Negative.   All other systems reviewed and are negative.     Objective:   Physical Exam Gen: NAD. Vital signs reviewed HENT: Normocephalic, Atraumatic Eyes: EOMI. No discharge.  Cardio: RRR. No JVD. Pulm: B/l clear to auscultation.  Effort normal Abd: Soft, BS+ MSK:  Gait antalgic.   TTP cervicolumbosacral PSPs.    No edema.   Limited ROM in lumbar spine Neuro:  Strength  4-4+/5 in all LE myotomes Skin: Warm and Dry. Intact    Assessment & Plan:  47 y/o male with pmh of severe ankylosing spondylitis and neuropathy, compression fracture of 2 vertebra, left should bursitis presents for follow up for AS   1. 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 Gait abnormality  Cont cane for safety  3. Sleep disturbance  Will increase Elavil to 25mg , education on signs and symptoms of serotonin syndrome  4. Myalgia   Pt to follow up with insurance regarding cost for trigger point injections

## 2018-01-23 NOTE — Patient Instructions (Addendum)
Beryle FlockMcLean, James Martin, MD   8cc of 0.5% Marcaine

## 2018-02-20 ENCOUNTER — Telehealth: Payer: Self-pay | Admitting: Physical Medicine & Rehabilitation

## 2018-02-20 NOTE — Telephone Encounter (Signed)
Patient had a question about the TENS unit.  He said he had never heard anything from them about it.  I reached out to Robynn PaneSara Clark with [facility], she said it may be an insurance issue with it.  She will be in our office tomorrow at 2 and would have an answer for us by then.  I have called patient back and left him a message letting him know that we should have an answer about his by closing tomorrow.

## 2018-03-07 NOTE — Progress Notes (Deleted)
Office Visit Note  Patient: Benjamin HarvestFrancis J Hoskinson Wiggins             Date of Birth: 06/05/1971           MRN: [id number]             PCP: Cheron SchaumannVelazquez, Gretchen Y., MD Referring: Cheron SchaumannVelazquez, Gretchen Y.,* Visit Date: 03/21/2018 Occupation: @GUAROCC @    Subjective:  No chief complaint on file.   History of Present Illness: Benjamin Wiggins is a 47 y.o. male ***   Activities of Daily Living:  Patient reports morning stiffness for *** {minute/hour:19697}.   Patient {ACTIONS;DENIES/REPORTS:21021675::"Denies"} nocturnal pain.  Difficulty dressing/grooming: {ACTIONS;DENIES/REPORTS:21021675::"Denies"} Difficulty climbing stairs: {ACTIONS;DENIES/REPORTS:21021675::"Denies"} Difficulty getting out of chair: {ACTIONS;DENIES/REPORTS:21021675::"Denies"} Difficulty using hands for taps, buttons, cutlery, and/or writing: {ACTIONS;DENIES/REPORTS:21021675::"Denies"}   No Rheumatology ROS completed.   PMFS History:  Patient Active Problem List   Diagnosis Date Noted  . Ankylosing spondylitis (HCC) 11/07/2016  . High risk medication use 11/07/2016  . Neuropathy 02/23/2016  . Numbness 02/23/2016  . Facial numbness 02/23/2016  . Gait disorder 02/23/2016  . Memory changes 02/23/2016    Past Medical History:  Diagnosis Date  . Ankylosing spondylitis (HCC)   . Neuropathy     Family History  Problem Relation Age of Onset  . Diabetes Father   . Heart disease Father   . Neuropathy Neg Hx    Past Surgical History:  Procedure Laterality Date  . ANKLE SURGERY     titatium plate   Social History   Social History Narrative   Lives with Mauri [place]enise Smith    Caffeine use: 3-4 cups per day     Objective: Vital Signs: There were no vitals taken for this visit.   Physical Exam   Musculoskeletal Exam: ***  CDAI Exam: No CDAI exam completed.    Investigation: No additional findings.Tb Gold: 07/18/2017 Negative  CBC Latest Ref Rng & Units 01/23/2018 10/17/2017 07/18/2017  WBC 3.8 - 10.8  Thousand/uL 6.2 6.0 5.7  Hemoglobin 13.2 - 17.1 g/dL 84.614.6 96.215.7 95.215.1  Hematocrit 38.5 - 50.0 % 42.5 44.8 44.2  Platelets 140 - 400 Thousand/uL 274 242 263   CMP Latest Ref Rng & Units 01/23/2018 10/17/2017 07/18/2017  Glucose 65 - 99 mg/dL 841(L100(H) 88 244(W108(H)  BUN 7 - 25 mg/dL 16 16 13   Creatinine 0.60 - 1.35 mg/dL 1.020.85 7.250.96 3.660.86  Sodium 135 - 146 mmol/L 137 137 136  Potassium 3.5 - 5.3 mmol/L 4.6 4.9 4.4  Chloride 98 - 110 mmol/L 105 101 102  CO2 20 - 32 mmol/L 24 27 20   Calcium 8.6 - 10.3 mg/dL 9.4 9.7 9.3  Total Protein 6.1 - 8.1 g/dL 7.4 7.4 7.0  Total Bilirubin 0.2 - 1.2 mg/dL 0.7 0.6 0.6  Alkaline Phos 40 - 115 U/L - - 96  AST 10 - 40 U/L 30 47(H) 30  ALT 9 - 46 U/L 58(H) 90(H) 62(H)    Imaging: No results found.  Speciality Comments: No specialty comments available.    Procedures:  No procedures performed Allergies: Iohexol and Iodinated diagnostic agents   Assessment / Plan:     Visit Diagnoses: No diagnosis found.    Orders: No orders of the defined types were placed in this encounter.  No orders of the defined types were placed in this encounter.   Face-to-face time spent with patient was *** minutes. 50% of time was spent in counseling and coordination of care.  Follow-Up Instructions: No follow-ups on file.   Janesa Dockery C  Levester Waldridge, CMA  Note - This record has been created using Editor, commissioning.  Chart creation errors have been sought, but may not always  have been located. Such creation errors do not reflect on  the standard of medical care.

## 2018-03-12 ENCOUNTER — Other Ambulatory Visit: Payer: Self-pay | Admitting: Rheumatology

## 2018-03-12 NOTE — Telephone Encounter (Signed)
Last visit: 10/31/2017 Next visit: 03/21/2018   Okay to refill per Dr. Corliss Skainseveshwar

## 2018-03-21 ENCOUNTER — Ambulatory Visit: Payer: Medicare Other | Admitting: Rheumatology

## 2018-03-27 ENCOUNTER — Other Ambulatory Visit: Payer: Self-pay | Admitting: Physical Medicine & Rehabilitation

## 2018-03-28 ENCOUNTER — Other Ambulatory Visit: Payer: Self-pay | Admitting: *Deleted

## 2018-03-28 NOTE — Telephone Encounter (Signed)
Error, duplicate

## 2018-04-15 ENCOUNTER — Other Ambulatory Visit: Payer: Self-pay | Admitting: Rheumatology

## 2018-04-15 MED FILL — ENBREL 50 MG/ML SURECLICK S: 50 | 84 days supply | Qty: 12 | Fill #0

## 2018-04-15 NOTE — Telephone Encounter (Signed)
Last visit: 10/31/2017 Next visit: was due in April 2019. Message sent to the front to schedule.  Labs: 01/23/18 AST WNL. ALT improving. CBC WNL. TB Gold: 07/18/17  Okay to refill per Dr. Corliss Skains

## 2018-04-18 NOTE — Progress Notes (Signed)
Office Visit Note  Patient: Benjamin Wiggins             Date of Birth: May 20, 1971           MRN: [id number]             PCP: Cheron Schaumann., MD Referring: Cheron Schaumann.,* Visit Date: 05/01/2018 Occupation: @    Subjective:  Lower back pain    History of Present Illness: Benjamin Wiggins is a 47 y.o. male with history of ankylosing spondylitis and DDD.  Patient states he continues to inject Enbrel subcutaneously every week.  He states that about 1 month ago he missed 2 doses due to being on antibiotics for a sinus infection.  While off of his medications he developed increased joint stiffness.  He states he continues to have chronic lower back and thoracic pain. He does not feel as though the enbrel is helping with his pain or stiffness. He feels his rib cage pain is worsening.  He states his level of pain fluctuates with his level of activity.  He has been trying to stay active and has been going to the pool more frequently.  He states he has been having increased pain in bilateral hands and ankles.  He states he has no pain in his neck at this time.  He states the pain in his left shoulder has also improved.  He reports he has good left shoulder ROM.  He has been having significant pain wake him up at night.  He has been avoiding tylenol and NSAIDs due to previously high LFTs.  He has been going to pain management and is being prescribed Tramadol.  He reports he is still waiting on receiving a TENS unit.  Activities of Daily Living:  Patient reports morning stiffness for 30 minutes.   Patient Reports nocturnal pain.  Difficulty dressing/grooming: Reports Difficulty climbing stairs: Reports Difficulty getting out of chair: Reports Difficulty using hands for taps, buttons, cutlery, and/or writing: Reports   Review of Systems  Constitutional: Positive for fatigue. Negative for night sweats.  HENT: Positive for mouth dryness. Negative for mouth sores  and nose dryness.   Eyes: Negative for redness and dryness.  Respiratory: Negative for shortness of breath and difficulty breathing.   Cardiovascular: Positive for swelling in legs/feet. Negative for chest pain, palpitations, hypertension and irregular heartbeat.  Gastrointestinal: Positive for constipation and diarrhea.  Endocrine: Negative for increased urination.  Genitourinary: Negative for painful urination and pelvic pain.  Musculoskeletal: Positive for arthralgias, joint pain, joint swelling and morning stiffness. Negative for myalgias, muscle weakness, muscle tenderness and myalgias.  Skin: Negative for color change, rash, hair loss, nodules/bumps, skin tightness, ulcers and sensitivity to sunlight.  Allergic/Immunologic: Negative for susceptible to infections.  Neurological: Negative for fainting, memory loss and night sweats.  Hematological: Negative for swollen glands.  Psychiatric/Behavioral: Negative for depressed mood, confusion and sleep disturbance. The patient is not nervous/anxious.     PMFS History:  Patient Active Problem List   Diagnosis Date Noted  . Ankylosing spondylitis (HCC) 11/07/2016  . High risk medication use 11/07/2016  . Neuropathy 02/23/2016  . Numbness 02/23/2016  . Facial numbness 02/23/2016  . Gait disorder 02/23/2016  . Memory changes 02/23/2016    Past Medical History:  Diagnosis Date  . Ankylosing spondylitis (HCC)   . Neuropathy     Family History  Problem Relation Age of Onset  . Diabetes Father   . Heart disease Father   .  Neuropathy Neg Hx    Past Surgical History:  Procedure Laterality Date  . ANKLE SURGERY     titatium plate   Social History   Social History Narrative   Lives with [name]    Caffeine use: 3-4 cups per day     Objective: Vital Signs: BP 118/79 (BP Location: Right Arm, Patient Position: Sitting, Cuff Size: Normal)   Pulse 100   Resp 15   Ht 6' (1.829 m)   Wt 217 lb (98.4 kg)   BMI 29.43 kg/m     Physical Exam  Constitutional: He is oriented to person, place, and time. He appears well-developed and well-nourished.  HENT:  Head: Normocephalic and atraumatic.  Eyes: Pupils are equal, round, and reactive to light. Conjunctivae and EOM are normal.  Neck: Normal range of motion. Neck supple.  Cardiovascular: Normal rate, regular rhythm and normal heart sounds.  Pulmonary/Chest: Effort normal and breath sounds normal.  Abdominal: Soft. Bowel sounds are normal.  Lymphadenopathy:    He has no cervical adenopathy.  Neurological: He is alert and oriented to person, place, and time.  Skin: Skin is warm and dry. Capillary refill takes less than 2 seconds.  Psychiatric: He has a normal mood and affect. His behavior is normal.  Nursing note and vitals reviewed.    Musculoskeletal Exam: C-spine limited ROM.  Thoracic and lumbar spine limited ROM with midline spinal tenderness.  Bilateral SI joint tenderness.  Shoulder joints, elbow joints. Wrist joints, MCPs, PIPs, and DIPs good ROM with no synovitis.  Hip joints, knee joints, ankle joints, MTPs, PIPs, and DIPs good ROM with no synovitis.  No warmth or effusion of knee joints.  Bilateral knee crepitus.  No tenderness of trochanteric bursa.  No achilles tendon or plantar fascia tenderness.    CDAI Exam: No CDAI exam completed.    Investigation: No additional findings.TB Gold: 07/18/2017 Negative  CBC Latest Ref Rng & Units 01/23/2018 10/17/2017 07/18/2017  WBC 3.8 - 10.8 Thousand/uL 6.2 6.0 5.7  Hemoglobin 13.2 - 17.1 g/dL 16.1 09.6 04.5  Hematocrit 38.5 - 50.0 % 42.5 44.8 44.2  Platelets 140 - 400 Thousand/uL 274 242 263   CMP Latest Ref Rng & Units 01/23/2018 10/17/2017 07/18/2017  Glucose 65 - 99 mg/dL 409(W) 88 119(J)  BUN 7 - 25 mg/dL Creatinine 0.60 - 1.35 mg/dL 4.78 2.95 6.21  Sodium 135 - 146 mmol/L 137 137 136  Potassium 3.5 - 5.3 mmol/L 4.6 4.9 4.4  Chloride 98 - 110 mmol/L 105 101 102  CO2 20 - 32 mmol/L Calcium 8.6 - 10.3 mg/dL 9.4 9.7 9.3  Total Protein 6.1 - 8.1 g/dL 7.4 7.4 7.0  Total Bilirubin 0.2 - 1.2 mg/dL 0.7 0.6 0.6  Alkaline Phos 40 - 115 U/L - - 96  AST 10 - 40 U/L 30 47(H) 30  ALT 9 - 46 U/L 58(H) 90(H) 62(H)    Imaging: No results found.  Speciality Comments: No specialty comments available.    Procedures:  No procedures performed Allergies: Iohexol and Iodinated diagnostic agents   Assessment / Plan:     Visit Diagnoses: Ankylosing spondylitis of multiple sites in spine [facility]) -he has limited range of motion of his C-spine, thoracic spine, lumbar spine. He has midline spinal tenderness in the thoracic and lumbar region.  He continues to have chronic pain in his lower back.  He has been trying to stay active and has been going to the pool on  a regular basis.  We will check a sed rate today.  He will continue injecting Enbrel subcutaneously once weekly.  He does not need a refill at this time.  We discussed the importance of weight loss and exercise to help with his lower back pain.  Plan: Sedimentation rate  High risk medication use - Enbrel 50 mg sq q wk, elevated LFTs in 07/2017 and 10/17/2017 probably related to NSAID use.  He has been avoiding NSAIDs.  CBC and CMP will be drawn today to monitor for drug toxicity.  He will return in August and every 3 months.  He will require TB gold with his next lab work. - Plan: CBC with Differential/Platelet, COMPLETE METABOLIC PANEL WITH GFR, QuantiFERON-TB Gold Plus, CBC with Differential/Platelet, COMPLETE METABOLIC PANEL WITH GFR  Chronic midline low back pain without sciatica: Chronic pain.  No symptoms of sciatica at this time.   DDD (degenerative disc disease), cervical: He has limited ROM of c-spine but no discomfort at this time.    Neuropathy - He is on Vit B6.    Non-traumatic compression fracture of twelfth thoracic vertebra, sequela: Chronic pain and midline spinal tenderness.   Tendinopathy of left shoulder: He has  no discomfort at this time.  He has full ROM on exam.    Orders: Orders Placed This Encounter  Procedures  . CBC with Differential/Platelet  . COMPLETE METABOLIC PANEL WITH GFR  . QuantiFERON-TB Gold Plus  . CBC with Differential/Platelet  . COMPLETE METABOLIC PANEL WITH GFR  . Sedimentation rate   No orders of the defined types were placed in this encounter.   Face-to-face time spent with patient was 30 minutes. >50% of time was spent in counseling and coordination of care.  Follow-Up Instructions: Return in about 5 months (around 10/01/2018) for Ankylosing Spondylitis, DDD.   Gearldine Bienenstock, PA-C   I examined and evaluated the patient with Sherron Ales PA.  Patient had no synovitis on examination.  He continues to have stiffness in his back.  He is doing better since he has been going to pain management.  I will check sedimentation rate today.  Weight loss diet and exercise was discussed at length.  The plan of care was discussed as noted above.  Pollyann Savoy, MD  Note - This record has been created using Animal nutritionist.  Chart creation errors have been sought, but may not always  have been located. Such creation errors do not reflect on  the standard of medical care.

## 2018-05-01 ENCOUNTER — Ambulatory Visit (INDEPENDENT_AMBULATORY_CARE_PROVIDER_SITE_OTHER): Payer: Medicare Other | Admitting: Rheumatology

## 2018-05-01 ENCOUNTER — Encounter: Payer: Self-pay | Admitting: Rheumatology

## 2018-05-01 VITALS — BP 118/79 | HR 100 | Resp 15 | Ht 72.0 in | Wt 217.0 lb

## 2018-05-01 DIAGNOSIS — G629 Polyneuropathy, unspecified: Secondary | ICD-10-CM | POA: Diagnosis not present

## 2018-05-01 DIAGNOSIS — G8929 Other chronic pain: Secondary | ICD-10-CM

## 2018-05-01 DIAGNOSIS — M4854XS Collapsed vertebra, not elsewhere classified, thoracic region, sequela of fracture: Secondary | ICD-10-CM

## 2018-05-01 DIAGNOSIS — M545 Low back pain: Secondary | ICD-10-CM | POA: Diagnosis not present

## 2018-05-01 DIAGNOSIS — M67912 Unspecified disorder of synovium and tendon, left shoulder: Secondary | ICD-10-CM

## 2018-05-01 DIAGNOSIS — M5030 Other cervical disc degeneration, unspecified cervical region: Secondary | ICD-10-CM

## 2018-05-01 DIAGNOSIS — M450 Ankylosing spondylitis of multiple sites in spine: Secondary | ICD-10-CM

## 2018-05-01 DIAGNOSIS — Z79899 Other long term (current) drug therapy: Secondary | ICD-10-CM

## 2018-05-01 NOTE — Patient Instructions (Signed)
Standing Labs We placed an order today for your standing lab work.    Please come back and get your standing labs in August and every 3 months   TB gold, CBC, and CMP   We have open lab Monday through Friday from 8:30-11:30 AM and 1:30-4:00 PM  at the office of Dr. Shaili Deveshwar.   You may experience shorter wait times on Monday and Friday afternoons. The office is located at [address], [address], [place], [place] [address] No appointment is necessary.   Labs are drawn by [facility].  You may receive a bill from [facility] for your lab work. If you have any questions regarding directions or hours of operation,  please call [phone].    

## 2018-05-02 LAB — COMPLETE METABOLIC PANEL WITH GFR
AG Ratio: 1.5 (calc) (ref 1.0–2.5)
ALBUMIN MSPROF: 4.5 g/dL (ref 3.6–5.1)
ALKALINE PHOSPHATASE (APISO): 92 U/L (ref 40–115)
ALT: 37 U/L (ref 9–46)
AST: 22 U/L (ref 10–40)
BUN: 12 mg/dL (ref 7–25)
CALCIUM: 9.8 mg/dL (ref 8.6–10.3)
CO2: 27 mmol/L (ref 20–32)
CREATININE: 0.9 mg/dL (ref 0.60–1.35)
Chloride: 102 mmol/L (ref 98–110)
GFR, EST AFRICAN AMERICAN: 117 mL/min/{1.73_m2} (ref >=60)
GFR, EST NON AFRICAN AMERICAN: 101 mL/min/{1.73_m2} (ref >=60)
GLOBULIN: 3 g/dL (ref 1.9–3.7)
GLUCOSE: 97 mg/dL (ref 65–99)
Potassium: 4.4 mmol/L (ref 3.5–5.3)
SODIUM: 137 mmol/L (ref 135–146)
TOTAL PROTEIN: 7.5 g/dL (ref 6.1–8.1)
Total Bilirubin: 0.5 mg/dL (ref 0.2–1.2)

## 2018-05-02 LAB — CBC WITH DIFFERENTIAL/PLATELET
BASOS ABS: 59 {cells}/uL (ref 0–200)
Basophils Relative: 0.9 %
Eosinophils Absolute: 79 cells/uL (ref 15–500)
Eosinophils Relative: 1.2 %
HEMATOCRIT: 44.2 % (ref 38.5–50.0)
HEMOGLOBIN: 15.4 g/dL (ref 13.2–17.1)
LYMPHS ABS: 1874 {cells}/uL (ref 850–3900)
MCH: 32.6 pg (ref 27.0–33.0)
MCHC: 34.8 g/dL (ref 32.0–36.0)
MCV: 93.6 fL (ref 80.0–100.0)
MPV: 9.8 fL (ref 7.5–12.5)
Monocytes Relative: 7.6 %
NEUTROS ABS: 4085 {cells}/uL (ref 1500–7800)
NEUTROS PCT: 61.9 %
Platelets: 237 10*3/uL (ref 140–400)
RBC: 4.72 10*6/uL (ref 4.20–5.80)
RDW: 11.8 % (ref 11.0–15.0)
Total Lymphocyte: 28.4 %
WBC: 6.6 10*3/uL (ref 3.8–10.8)
WBCMIX: 502 {cells}/uL (ref 200–950)

## 2018-05-02 LAB — SEDIMENTATION RATE: Sed Rate: 6 mm/h (ref 0–15)

## 2018-05-02 NOTE — Progress Notes (Signed)
Labs are WNL.

## 2018-05-03 NOTE — Progress Notes (Signed)
NOTIFIED PT OF LAB RESULTS 

## 2018-06-12 ENCOUNTER — Other Ambulatory Visit: Payer: Self-pay | Admitting: Rheumatology

## 2018-06-12 NOTE — Telephone Encounter (Signed)
Last Visit: 05/01/18 Next Visit: 10/01/18  Okay to refill per Dr. Corliss Skainseveshwar

## 2018-06-27 ENCOUNTER — Other Ambulatory Visit: Payer: Self-pay | Admitting: Rheumatology

## 2018-06-27 NOTE — Telephone Encounter (Signed)
Last Visit: 05/01/18 Next Visit: 10/01/18 Labs: 05/01/18 WNL TB Gold: 05/01/18 Neg   Okay to refill per Dr. Corliss Skainseveshwar

## 2018-07-08 MED FILL — ENBREL 50 MG/ML SURECLICK S: 50 | 84 days supply | Qty: 12 | Fill #0

## 2018-09-17 NOTE — Progress Notes (Signed)
Office Visit Note  Patient: Benjamin Wiggins             Date of Birth: Nov 09, 1971           MRN: [id number]             PCP: Cheron Schaumann., MD Referring: Cheron Schaumann.,* Visit Date: 10/01/2018 Occupation: @GUAROCC @  Subjective:  Left hip pain.   History of Present Illness: Benjamin Wiggins is a 47 y.o. male history of ankylosing spondylitis and degenerative disc disease.  He states he continues to have some pain and stiffness in his joints.  He complains of pain over his left trochanteric bursa to the point he has difficulty walking at times.  He also complains of discomfort in his lumbar region.  He does not have any joint swelling currently.  He continues to have pain in his left shoulder.  Activities of Daily Living:  Patient reports morning stiffness for 45 minutes.   Patient Reports nocturnal pain.  Difficulty dressing/grooming: Denies Difficulty climbing stairs: Reports Difficulty getting out of chair: Denies Difficulty using hands for taps, buttons, cutlery, and/or writing: Denies  Review of Systems  Constitutional: Positive for fatigue. Negative for night sweats.  HENT: Positive for mouth dryness. Negative for mouth sores and nose dryness.   Eyes: Negative for redness and dryness.  Respiratory: Negative for shortness of breath and difficulty breathing.   Cardiovascular: Negative for chest pain, palpitations, hypertension, irregular heartbeat and swelling in legs/feet.  Gastrointestinal: Positive for constipation and diarrhea.  Endocrine: Negative for increased urination.  Musculoskeletal: Positive for arthralgias, joint pain, myalgias, morning stiffness and myalgias. Negative for joint swelling, muscle weakness and muscle tenderness.  Skin: Negative for color change, rash, hair loss, nodules/bumps, skin tightness, ulcers and sensitivity to sunlight.  Allergic/Immunologic: Negative for susceptible to infections.  Neurological: Negative for  dizziness, fainting, memory loss, night sweats and weakness ( ).  Hematological: Negative for swollen glands.  Psychiatric/Behavioral: Negative for depressed mood and sleep disturbance. The patient is not nervous/anxious.     PMFS History:  Patient Active Problem List   Diagnosis Date Noted  . DDD (degenerative disc disease), cervical 10/01/2018  . Ankylosing spondylitis (HCC) 11/07/2016  . High risk medication use 11/07/2016  . Neuropathy 02/23/2016  . Numbness 02/23/2016  . Facial numbness 02/23/2016  . Gait disorder 02/23/2016  . Memory changes 02/23/2016    Past Medical History:  Diagnosis Date  . Ankylosing spondylitis (HCC)   . Neuropathy     Family History  Problem Relation Age of Onset  . Diabetes Father   . Heart disease Father   . Neuropathy Neg Hx    Past Surgical History:  Procedure Laterality Date  . ANKLE SURGERY     titatium plate   Social History   Social History Narrative   Lives with Benjamin Wiggins    Caffeine use: 3-4 cups per day   Immunization History  Administered Date(s) Administered  . Influenza-Unspecified 08/22/2017, 09/17/2018  . Pneumococcal Polysaccharide-23 08/22/2017    Objective: Vital Signs: BP 119/77 (BP Location: Right Arm, Patient Position: Sitting, Cuff Size: Normal)   Pulse 86   Resp 13   Ht 6' (1.829 m)   Wt 216 lb 9.6 oz (98.2 kg)   BMI 29.38 kg/m    Physical Exam  Constitutional: He is oriented to person, place, and time. He appears well-developed and well-nourished.  HENT:  Head: Normocephalic and atraumatic.  Eyes: Pupils are equal, round, and reactive to  light. Conjunctivae and EOM are normal.  Neck: Normal range of motion. Neck supple.  Cardiovascular: Normal rate, regular rhythm and normal heart sounds.  Pulmonary/Chest: Effort normal and breath sounds normal.  Abdominal: Soft. Bowel sounds are normal.  Neurological: He is alert and oriented to person, place, and time.  Skin: Skin is warm and dry. Capillary  refill takes less than 2 seconds.  Psychiatric: He has a normal mood and affect. His behavior is normal.  Nursing note and vitals reviewed.    Musculoskeletal Exam: C-spine good range of motion.  He has some limitation of lumbar spine.  He had no thoracic kyphosis.  Shoulder joints elbow joints wrist joint MCPs PIPs DIPs were in good range of motion with no synovitis.  Hip joints knee joints ankles MTPs PIPs been good range of motion with no synovitis.  He has some tenderness on palpation over left trochanteric bursa.  CDAI Exam: CDAI Score: Not documented Patient Global Assessment: Not documented; Provider Global Assessment: Not documented Swollen: Not documented; Tender: Not documented Joint Exam   Not documented   There is currently no information documented on the homunculus. Go to the Rheumatology activity and complete the homunculus joint exam.  Investigation: No additional findings.  Imaging: No results found.  Recent Labs: Lab Results  Component Value Date   WBC 6.6 05/01/2018   HGB 15.4 05/01/2018   PLT 237 05/01/2018   NA 137 05/01/2018   K 4.4 05/01/2018   CL 102 05/01/2018   CO2 27 05/01/2018   GLUCOSE 97 05/01/2018   BUN 12 05/01/2018   CREATININE 0.90 05/01/2018   BILITOT 0.5 05/01/2018   ALKPHOS 96 07/18/2017   AST 22 05/01/2018   ALT 37 05/01/2018   PROT 7.5 05/01/2018   ALBUMIN 4.3 07/18/2017   CALCIUM 9.8 05/01/2018   GFRAA 117 05/01/2018  September 17, 2018 CBC normal, CMP normal, TB Gold negative  Speciality Comments: No specialty comments available.  Procedures:  No procedures performed Allergies: Iohexol and Iodinated diagnostic agents   Assessment / Plan:      Visit Diagnoses: Ankylosing spondylitis of multiple sites in spine (HCC)-patient is clinically doing well with good range of motion of his spine.  He is also exercising on a regular basis.  He complains of some stiffness in his lumbar region.  High risk medication use - Enbrel 50 mg  sq q wk.  His labs in October were normal which were reviewed today.  TB gold was negative and October as well.  DDD (degenerative disc disease), cervical-he continues to have some stiffness in cervical spine.  Trochanteric bursitis of left hip-he states he is having difficulty climbing stairs and walking due to left trochanteric bursitis.  Different treatment options and their side effects were discussed.  I offered cortisone injection but he declined.  Have given him IT band exercises.  Neuropathy - He is on Vit B6.  He was evaluated by neurology in the past.  Tendinopathy of left shoulder-he continues to have some discomfort.  He had good range of motion on examination without any discomfort today.  Non-traumatic compression fracture of twelfth thoracic vertebra, sequela   I advised him to schedule appointment with dermatologist for an annual skin examination as he is on anti-TNF therapy.  Orders: No orders of the defined types were placed in this encounter.  No orders of the defined types were placed in this encounter.   Face-to-face time spent with patient was 30 minutes. Greater than 50% of time was spent  in counseling and coordination of care.  Follow-Up Instructions: Return in about 5 months (around 03/02/2019) for Ankylosing spondylitis.   Pollyann Savoy, MD  Note - This record has been created using Animal nutritionist.  Chart creation errors have been sought, but may not always  have been located. Such creation errors do not reflect on  the standard of medical care.

## 2018-09-19 ENCOUNTER — Telehealth: Payer: Self-pay | Admitting: *Deleted

## 2018-09-19 NOTE — Telephone Encounter (Signed)
Labwork received from [facility] drawn on 09/27/18 WNL. Results in Care everywhere

## 2018-09-24 ENCOUNTER — Other Ambulatory Visit: Payer: Self-pay | Admitting: Rheumatology

## 2018-09-25 NOTE — Telephone Encounter (Signed)
Last Visit: 05/01/18 Next Visit: 10/01/18 Labs: 09/17/18 WNL TB Gold: 05/01/18 Neg   Okay to refill per Dr. Corliss Skains

## 2018-10-01 ENCOUNTER — Ambulatory Visit (INDEPENDENT_AMBULATORY_CARE_PROVIDER_SITE_OTHER): Payer: Medicare Other | Admitting: Rheumatology

## 2018-10-01 ENCOUNTER — Encounter: Payer: Self-pay | Admitting: Rheumatology

## 2018-10-01 VITALS — BP 119/77 | HR 86 | Resp 13 | Ht 72.0 in | Wt 216.6 lb

## 2018-10-01 DIAGNOSIS — M7062 Trochanteric bursitis, left hip: Secondary | ICD-10-CM

## 2018-10-01 DIAGNOSIS — M5030 Other cervical disc degeneration, unspecified cervical region: Secondary | ICD-10-CM | POA: Insufficient documentation

## 2018-10-01 DIAGNOSIS — M67912 Unspecified disorder of synovium and tendon, left shoulder: Secondary | ICD-10-CM

## 2018-10-01 DIAGNOSIS — M4854XS Collapsed vertebra, not elsewhere classified, thoracic region, sequela of fracture: Secondary | ICD-10-CM

## 2018-10-01 DIAGNOSIS — Z79899 Other long term (current) drug therapy: Secondary | ICD-10-CM

## 2018-10-01 DIAGNOSIS — M450 Ankylosing spondylitis of multiple sites in spine: Secondary | ICD-10-CM | POA: Diagnosis not present

## 2018-10-01 DIAGNOSIS — G629 Polyneuropathy, unspecified: Secondary | ICD-10-CM

## 2018-10-01 NOTE — Patient Instructions (Signed)
Standing Labs We placed an order today for your standing lab work.    Please come back and get your standing labs in January and every 3 months  We have open lab Monday through Friday from 8:30-11:30 AM and 1:30-4:00 PM  at the office of Dr. Pollyann Savoy.   You may experience shorter wait times on Monday and Friday afternoons. The office is located at [address], [place], [place] [address] No appointment is necessary.   Labs are drawn by [facility].  You may receive a bill from [facility] for your lab work. If you have any questions regarding directions or hours of operation,  please call [phone].   Just as a reminder please drink plenty of water prior to coming for your lab work. Thanks!   Iliotibial Band Syndrome Rehab Ask your health care provider which exercises are safe for you. Do exercises exactly as told by your health care provider and adjust them as directed. It is normal to feel mild stretching, pulling, tightness, or discomfort as you do these exercises, but you should stop right away if you feel sudden pain or your pain gets worse.Do not begin these exercises until told by your health care provider. Stretching and range of motion exercises These exercises warm up your muscles and joints and improve the movement and flexibility of your hip and pelvis. Exercise A: Quadriceps, prone  1. Lie on your abdomen on a firm surface, such as a bed or padded floor. 2. Bend your left / right knee and hold your ankle. If you cannot reach your ankle or pant leg, loop a belt around your foot and grab the belt instead. 3. Gently pull your heel toward your buttocks. Your knee should not slide out to the side. You should feel a stretch in the front of your thigh and knee. 4. Hold this position for __________ seconds. Repeat __________ times. Complete this stretch __________ times a day. Exercise B: Iliotibial band  1. Lie on your side with your left / right leg in the top  position. 2. Bend both of your knees and grab your left / right ankle. Stretch out your bottom arm to help you balance. 3. Slowly bring your top knee back so your thigh goes behind your trunk. 4. Slowly lower your top leg toward the floor until you feel a gentle stretch on the outside of your left / right hip and thigh. If you do not feel a stretch and your knee will not fall farther, place the heel of your other foot on top of your knee and pull your knee down toward the floor with your foot. 5. Hold this position for __________ seconds. Repeat __________ times. Complete this stretch __________ times a day. Strengthening exercises These exercises build strength and endurance in your hip and pelvis. Endurance is the ability to use your muscles for a long time, even after they get tired. Exercise C: Straight leg raises ( hip abductors) 1. Lie on your side with your left / right leg in the top position. Lie so your head, shoulder, knee, and hip line up. You may bend your bottom knee to help you balance. 2. Roll your hips slightly forward so your hips are stacked directly over each other and your left / right knee is facing forward. 3. Tense the muscles in your outer thigh and lift your top leg 4-6 inches (10-15 cm). 4. Hold this position for __________ seconds. 5. Slowly return to the starting position. Let your  muscles relax completely before doing another repetition. Repeat __________ times. Complete this exercise __________ times a day. Exercise D: Straight leg raises ( hip extensors) 1. Lie on your abdomen on your bed or a firm surface. You can put a pillow under your hips if that is more comfortable. 2. Bend your left / right knee so your foot is straight up in the air. 3. Squeeze your buttock muscles and lift your left / right thigh off the bed. Do not let your back arch. 4. Tense this muscle as hard as you can without increasing any knee pain. 5. Hold this position for __________  seconds. 6. Slowly lower your leg to the starting position and allow it to relax completely. Repeat __________ times. Complete this exercise __________ times a day. Exercise E: Hip hike 1. Stand sideways on a bottom step. Stand on your left / right leg with your other foot unsupported next to the step. You can hold onto the railing or wall if needed for balance. 2. Keep your knees straight and your torso square. Then, lift your left / right hip up toward the ceiling. 3. Slowly let your left / right hip lower toward the floor, past the starting position. Your foot should get closer to the floor. Do not lean or bend your knees. Repeat __________ times. Complete this exercise __________ times a day. This information is not intended to replace advice given to you by your health care provider. Make sure you discuss any questions you have with your health care provider. Document Released: 11/20/2005 Document Revised: 07/25/2016 Document Reviewed: 10/22/2015 Elsevier Interactive Patient Education  [employer].

## 2018-10-03 MED FILL — ENBREL SURECLICK 50 MG/ML S: 50 | 84 days supply | Qty: 12 | Fill #0

## 2018-12-20 ENCOUNTER — Telehealth: Payer: Self-pay | Admitting: Pharmacist

## 2018-12-20 NOTE — Telephone Encounter (Signed)
[facility]  Called patient today to follow up regarding patient's rheumatology medication: Enbrel.    He states he is doing okay on Enbrel.  He has had some medication changes recently and is unsure whether his issues are from Enbrel or medication changes. He had sweating and nausea with Cymbalta and stopped the medication.  His symptoms improved but says he still "just doesn't feel right".  I asked specifically what symptoms were bothering him. He stated he had a list of questions but was making dinner and would call back to discuss on Monday.  He did mentioned concern about dry patches.  Patient knows to call the office with questions or concerns. Rheumatology Clinic will continue to follow.  Verlin FesterAmber Curlee Bogan, PharmD, [facility] CenterBCACP Rheumatology Clinical Pharmacist  12/20/2018 3:31 PM

## 2018-12-23 ENCOUNTER — Other Ambulatory Visit: Payer: Self-pay | Admitting: Rheumatology

## 2018-12-23 NOTE — Telephone Encounter (Signed)
Last Visit: 10/01/18 Next Visit: 03/04/19  Okay to refill per Dr. Corliss Skainseveshwar

## 2018-12-23 NOTE — Telephone Encounter (Signed)
Last Visit: 10/01/18 Next Visit: 03/04/19 Labs: September 17, 2018 CBC normal, CMP normal, TB gold: 09/17/18 Neg   Attempted to contact the patient unable to leave a message. Patient due for labs.   Okay to refill 30 day supply per Dr. Corliss Skains

## 2018-12-25 ENCOUNTER — Telehealth: Payer: Self-pay | Admitting: Rheumatology

## 2018-12-25 NOTE — Telephone Encounter (Signed)
He should hold Enbrel and see GI specialist.  He should not restart Enbrel until clearance from the GI.

## 2018-12-25 NOTE — Telephone Encounter (Signed)
Patient states he went the emergency and they found 3 kidney stones. Patient states on the CT scan showed diverticulitis and fatty liver liver disease and patient will need to follow up with GI specialist. Patient is currently on antibiotics so he is holding Enbrel.   CT scan results per [facility] :  1. 2 mm left ureteral vesicle junction obstructing stone with mild  left hydroureteronephrosis.  2. Left upper pole nonobstructing 4 mm stone. Left lower pole 2 cm  cyst. Left lower pole 6 mm low-density structure possibly a cyst but  too small to characterize.  3. Multiple diverticula throughout the descending colon and sigmoid  colon. Minimal haziness surrounds diverticula at the junction of the  descending colon sigmoid colon (series 5 images 78 and 79) raises  possibility of very mild diverticulitis.  4. Enlarged fatty liver spanning over 19.3 cm. Four low-density  lesions within the liver measuring between 6 and 10 mm have  Hounsfield units suggesting these are cysts. These have increased in  size from the prior examination and if the patient has a known  malignancy than dedicated contrast enhanced liver MR recommended for  further delineation.  5. Findings suggestive of ankylosing spondylitis.  6. Trace abdominal aortic calcification.   Patient has contacted PCP has not heard back from them. Patient has names from emergency room for GI and Urologist. He needs to contact them and make appointments. Patient advised to hold Enbrel while on antibiotics.

## 2018-12-25 NOTE — Telephone Encounter (Signed)
Patient left a voicemail stating he had to go to the ER last evening and states he has an update on his "medical stuff" that he would like to discuss with Dr. Corliss Skains to see which direction he needs to go.

## 2018-12-26 NOTE — Telephone Encounter (Signed)
Patient advised he should hold Enbrel and see GI specialist.  He should not restart Enbrel until clearance from the GI. Patient states is on Metronidazole 500 mg and Ciprofloxacin 500 mg. Patient advised to contact the PCP to discuss the side effects of the antibiotics and his current medications.

## 2018-12-31 MED FILL — ENBREL SURECLICK 50 MG/ML S: 50 | 28 days supply | Qty: 4 | Fill #0

## 2019-01-22 ENCOUNTER — Other Ambulatory Visit: Payer: Self-pay | Admitting: Rheumatology

## 2019-03-03 NOTE — Progress Notes (Signed)
Virtual Visit via Telephone Note  I connected with Benjamin Wiggins on 03/04/19 at  8:45 AM EDT by telephone and verified that I am speaking with the correct person using two identifiers.   I discussed the limitations, risks, security and privacy concerns of performing an evaluation and management service by telephone and the availability of in person appointments. I also discussed with the patient that there may be a patient responsible charge related to this service. The patient expressed understanding and agreed to proceed.  CC: Neck and lower back pain History of Present Illness: Patient is a 48 year old male with a past medical history of ankylosing spondylitis, DDD, and trochanteric bursitis. He is on Enbrel 50 mg sq weekly injections. He reports in January he was diagnosed with a kidney stone and diverticulitis.  He was started on antibiotics and held his dose of Enbrel for 3-4 weeks. He resumed Enbrel 2 weeks ago.  He is having increased neck and lower back pain.  He takes tramadol for pain relief which has not been effective.  He states if he walks for prolonged distances his left knee joint causes discomfort.  He has some discomfort of the left trochanteric bursa.  He has no left shoulder joint pain at this time.      Review of Systems  Constitutional: Negative for fever and malaise/fatigue.  HENT:       Denies mouth dryness Intermittent oral ulcerations   Eyes: Negative for photophobia, pain and redness.       Denies eye dryness  Respiratory: Negative for cough and wheezing.   Cardiovascular: Negative for chest pain and palpitations.  Gastrointestinal: Negative for constipation, diarrhea and nausea.  Genitourinary: Negative for dysuria.  Musculoskeletal: Positive for back pain, joint pain and neck pain. Negative for myalgias.  Skin: Negative for rash.  Neurological: Negative for dizziness and headaches.   Observations/Objective: Physical Exam  Constitutional: He is  oriented to person, place, and time.  Neurological: He is alert and oriented to person, place, and time.  Psychiatric: Mood, memory, affect and judgment normal.    Morning stiffness: lasts 3-4 hours Reports nocturnal pain  Difficulty getting up from a chair: reports Difficulty climbing steps: reports Limited ROM of lumbar spine.    Assessment and Plan: Ankylosing spondylitis of multiple sites in spine [facility])- He has chronic neck and lower back pain and stiffness.  On 12/25/18 he was diagnosed with diverticulitis, left kidney stone, and left hydroureteronephrosis.  He was started on ciprofloxacin and flagyl for treatment of diverticulitis.  He held his dose of Enbrel for 3-4 weeks.   He resumed Enbrel 2 weeks ago.  He has noticed increased morning stiffness in his neck and lower back.  He has been taking tramadol for pain relief.  He will continue injecting Enbrel 50 mg sq once weekly.  A refill will be sent to the pharmacy.  He was advised to notify us if he develops increased joint pain or joint swelling.  He will follow up in 5 months.   High risk medication use - Enbrel 50 mg sq q wk.  lab work found under care everywhere CBC WNL on 12/24/18. CMP-ALT 56 and glucose-102 on 12/24/18.  TB gold negative on 09/17/18.  He is due for lab work in April and every 3 months to monitor for drug toxicity. He was discouraged from taking NSAIDs during the coronavirus pandemic.     DDD (degenerative disc disease), cervical-He has chronic neck pain and stiffness.  Trochanteric bursitis of left hip- He has occasional discomfort if walking for prolonged distances.   Neuropathy - He is on Vit B6 100 mg by mouth.  He was evaluated by neurology in the past.  Tendinopathy of left shoulder-He has no left shoulder pain at this time.  He has no difficulty with overhead activities.    Diverticulitis: He was diagnosed with diverticulitis on 12/25/18.  He was treated with Flagyl and ciprofloxacin.  He held his dose  of Enbrel for 3-4 weeks during this time.  He was advised to follow up with GI outpatient for further evaluation due to noticing occasional blood in his stool.   History of kidney stones: He was evaluated in the ED on 12/25/18.  He is following up outpatient with urology.    Follow Up Instructions: He will return for CBC and CMP in April and every 3 months. Standing orders are in place.    He will return for an office visit in 5 months.    I discussed the assessment and treatment plan with the patient. The patient was provided an opportunity to ask questions and all were answered. The patient agreed with the plan and demonstrated an understanding of the instructions.   The patient was advised to call back or seek an in-person evaluation if the symptoms worsen or if the condition fails to improve as anticipated.  I provided 25 minutes of non-face-to-face time during this encounter.  Pollyann Savoy, MD   Scribed by-   Gearldine Bienenstock, PA-C

## 2019-03-04 ENCOUNTER — Encounter: Payer: Self-pay | Admitting: Rheumatology

## 2019-03-04 ENCOUNTER — Telehealth (INDEPENDENT_AMBULATORY_CARE_PROVIDER_SITE_OTHER): Payer: Medicare Other | Admitting: Rheumatology

## 2019-03-04 DIAGNOSIS — Z79899 Other long term (current) drug therapy: Secondary | ICD-10-CM

## 2019-03-04 DIAGNOSIS — M4854XS Collapsed vertebra, not elsewhere classified, thoracic region, sequela of fracture: Secondary | ICD-10-CM

## 2019-03-04 DIAGNOSIS — Z87442 Personal history of urinary calculi: Secondary | ICD-10-CM

## 2019-03-04 DIAGNOSIS — M450 Ankylosing spondylitis of multiple sites in spine: Secondary | ICD-10-CM | POA: Diagnosis not present

## 2019-03-04 DIAGNOSIS — M5030 Other cervical disc degeneration, unspecified cervical region: Secondary | ICD-10-CM

## 2019-03-04 DIAGNOSIS — M7062 Trochanteric bursitis, left hip: Secondary | ICD-10-CM | POA: Diagnosis not present

## 2019-03-04 DIAGNOSIS — K5792 Diverticulitis of intestine, part unspecified, without perforation or abscess without bleeding: Secondary | ICD-10-CM

## 2019-03-04 DIAGNOSIS — M67912 Unspecified disorder of synovium and tendon, left shoulder: Secondary | ICD-10-CM

## 2019-03-04 DIAGNOSIS — G629 Polyneuropathy, unspecified: Secondary | ICD-10-CM

## 2019-03-04 MED ORDER — ETANERCEPT 50 MG/ML ~~LOC~~ SOAJ: 50.0000 mg | SUBCUTANEOUS | 0 refills | Status: DC

## 2019-03-04 NOTE — Patient Instructions (Signed)
Standing Labs We placed an order today for your standing lab work.    Please come back and get your standing labs in April and every 3 months  We have open lab Monday through Friday from 8:30-11:30 AM and 1:30-4:00 PM  at the office of Dr. Shaili Deveshwar.   You may experience shorter wait times on Monday and Friday afternoons. The office is located at [address], [address], [place], [place] [address] No appointment is necessary.   Labs are drawn by [facility].  You may receive a bill from [facility] for your lab work.  If you wish to have your labs drawn at another location, please call the office 24 hours in advance to send orders.  If you have any questions regarding directions or hours of operation,  please call [phone].   Just as a reminder please drink plenty of water prior to coming for your lab work. Thanks!  

## 2019-03-31 MED FILL — ENBREL SURECLICK 50 MG/ML S: 50 | 84 days supply | Qty: 12 | Fill #0

## 2019-04-07 ENCOUNTER — Other Ambulatory Visit: Payer: Self-pay | Admitting: Rheumatology

## 2019-04-08 NOTE — Telephone Encounter (Signed)
Last Visit:03/04/2019 telemedicine Next Visit:08/05/2019  Okay to refill per Dr. Deveshwar  

## 2019-06-24 ENCOUNTER — Other Ambulatory Visit: Payer: Self-pay | Admitting: Rheumatology

## 2019-06-25 ENCOUNTER — Other Ambulatory Visit: Payer: Self-pay | Admitting: Rheumatology

## 2019-06-25 ENCOUNTER — Telehealth: Payer: Self-pay | Admitting: Pharmacy Technician

## 2019-06-25 NOTE — Telephone Encounter (Signed)
Last Visit: 03/04/2019 telemedicine  Next Visit: 08/05/2019 Labs: 05/06/19 WNL (Care Everywhere)  TB Gold: 09/17/18 Neg   Okay to refill per Dr. Estanislado Pandy

## 2019-06-25 NOTE — Telephone Encounter (Signed)
Attempted to contact that patient and unable to leave a message. Mailbox is full.

## 2019-06-25 NOTE — Telephone Encounter (Signed)
Patient spoke to pharmacy and voiced concerns about taking Enbrel and dealing with Covid 19. Requesting a call back to advise. He said you may need to leave a message, because he has a new phone and does not get great service.  Patient also need a pharmacy clinical in [place].  2:32 PM Beatriz Chancellor, CPhT

## 2019-06-26 ENCOUNTER — Other Ambulatory Visit: Payer: Self-pay | Admitting: *Deleted

## 2019-06-27 NOTE — Telephone Encounter (Signed)
Patient wanted to know what precautions he should take and if he should continue the Enbrel during Covid. Patient advised it is recommended to continue medicaiton as prescribed. Patient advised he is immunocompromised which puts him at an increased risk. Patient advised to continue wearing a mask, social distancing and hand washing. Patient states he was sent a letter from somewhere but is unsure where that advised him to take Enbrel every other week. Patient advised we did not send letter and that according to his last visit on 03/04/19 he is to continue to Enbrel weekly.

## 2019-07-01 MED FILL — ENBREL SURECLICK 50 MG/ML S: 50 | 84 days supply | Qty: 12 | Fill #0

## 2019-07-14 ENCOUNTER — Other Ambulatory Visit: Payer: Self-pay | Admitting: Rheumatology

## 2019-07-14 NOTE — Telephone Encounter (Signed)
Last Visit:03/04/2019 telemedicine Next Visit:08/05/2019  Okay to refill per Dr. Estanislado Pandy

## 2019-07-22 NOTE — Progress Notes (Signed)
Office Visit Note  Patient: Benjamin Wiggins             Date of Birth: Sep 03, 1971           MRN: [id number]             PCP: Cheron SchaumannVelazquez, Gretchen Y., MD Referring: Cheron SchaumannVelazquez, Gretchen Y.,* Visit Date: 08/05/2019 Occupation: @GUAROCC @  Subjective:  Lower back pain    History of Present Illness: Benjamin Wiggins is a 48 y.o. male with history of ankylosing and DDD.He is on Enbrel 50 mg sq weekly injections.  He experiencing upper lumbar back pain over the past 3 weeks.  He states the pain is localized.  He denies any radiculopathy.  He describes it as a dull ache.  He has been having morning stiffness for 1 hour.  He has been having increased pain in both hands.  He denies any joint swelling. He continues to swim in his home pool on a regular basis.   Activities of Daily Living:  Patient reports morning stiffness for 1 hour.   Patient Reports nocturnal pain.  Difficulty dressing/grooming: Denies Difficulty climbing stairs: Denies Difficulty getting out of chair: Reports Difficulty using hands for taps, buttons, cutlery, and/or writing: Denies  Review of Systems  Constitutional: Negative for fatigue and night sweats.  HENT: Negative for mouth sores, mouth dryness and nose dryness.   Eyes: Negative for redness and dryness.  Respiratory: Negative for cough, hemoptysis, shortness of breath and difficulty breathing.   Cardiovascular: Negative for chest pain, palpitations, hypertension, irregular heartbeat and swelling in legs/feet.  Gastrointestinal: Negative for blood in stool, constipation and diarrhea.  Endocrine: Negative for increased urination.  Genitourinary: Negative for painful urination.  Musculoskeletal: Positive for arthralgias, joint pain and morning stiffness. Negative for joint swelling, myalgias, muscle weakness, muscle tenderness and myalgias.  Skin: Negative for color change, rash, hair loss, nodules/bumps, skin tightness, ulcers and sensitivity to  sunlight.  Allergic/Immunologic: Negative for susceptible to infections.  Neurological: Negative for dizziness, fainting, memory loss, night sweats and weakness.  Hematological: Negative for swollen glands.  Psychiatric/Behavioral: Negative for depressed mood and sleep disturbance. The patient is not nervous/anxious.     PMFS History:  Patient Active Problem List   Diagnosis Date Noted   DDD (degenerative disc disease), cervical 10/01/2018   Ankylosing spondylitis (HCC) 11/07/2016   High risk medication use 11/07/2016   Neuropathy 02/23/2016   Numbness 02/23/2016   Facial numbness 02/23/2016   Gait disorder 02/23/2016   Memory changes 02/23/2016    Past Medical History:  Diagnosis Date   Ankylosing spondylitis (HCC)    Neuropathy     Family History  Problem Relation Age of Onset   Diabetes Father    Heart disease Father    Neuropathy Neg Hx    Past Surgical History:  Procedure Laterality Date   ANKLE SURGERY     titatium plate   Social History   Social History Narrative   Lives with Benjamin Wiggins    Caffeine use: 3-4 cups per day   Immunization History  Administered Date(s) Administered   Influenza-Unspecified 08/22/2017, 09/17/2018   Pneumococcal Conjugate-13 09/17/2018   Pneumococcal Polysaccharide-23 08/22/2017     Objective: Vital Signs: BP 113/80 (BP Location: Right Arm, Patient Position: Sitting, Cuff Size: Normal)    Pulse 78    Resp 14    Ht 6' (1.829 m)    Wt 200 lb (90.7 kg)    BMI 27.12 kg/m    Physical Exam  Vitals signs and nursing note reviewed.  Constitutional:      Appearance: He is well-developed.  HENT:     Head: Normocephalic and atraumatic.  Eyes:     Conjunctiva/sclera: Conjunctivae normal.     Pupils: Pupils are equal, round, and reactive to light.  Neck:     Musculoskeletal: Normal range of motion and neck supple.  Cardiovascular:     Rate and Rhythm: Normal rate and regular rhythm.     Heart sounds: Normal heart  sounds.  Pulmonary:     Effort: Pulmonary effort is normal.     Breath sounds: Normal breath sounds.  Abdominal:     General: Bowel sounds are normal.     Palpations: Abdomen is soft.  Skin:    General: Skin is warm and dry.     Capillary Refill: Capillary refill takes less than 2 seconds.  Neurological:     Mental Status: He is alert and oriented to person, place, and time.  Psychiatric:        Behavior: Behavior normal.      Musculoskeletal Exam: C-spine good ROM.  Limited ROM of lumbar spine. Midline spinal tenderness in the lumbar region. No SI joint tenderness.  Shoulder joints, elbow joints, wrist joints, MCPs, PIPs, and DIPs good ROM with no synovitis.  Hip joints, knee joints, ankle joints, MTPs, PIPs, and DIPs good ROM with no synovitis.  No warmth or effusion of knee joints.  No tenderness or swelling of ankle joints.  No achilles tendonitis or plantar fasciitis.    CDAI Exam: CDAI Score: -- Patient Global: --; Provider Global: -- Swollen: --; Tender: -- Joint Exam   No joint exam has been documented for this visit   There is currently no information documented on the homunculus. Go to the Rheumatology activity and complete the homunculus joint exam.  Investigation: No additional findings.  Imaging: No results found.  Recent Labs: Lab Results  Component Value Date   WBC 6.6 05/01/2018   HGB 15.4 05/01/2018   PLT 237 05/01/2018   NA 137 05/01/2018   K 4.4 05/01/2018   CL 102 05/01/2018   CO2 27 05/01/2018   GLUCOSE 97 05/01/2018   BUN 12 05/01/2018   CREATININE 0.90 05/01/2018   BILITOT 0.5 05/01/2018   ALKPHOS 96 07/18/2017   AST 22 05/01/2018   ALT 37 05/01/2018   PROT 7.5 05/01/2018   ALBUMIN 4.3 07/18/2017   CALCIUM 9.8 05/01/2018   GFRAA 117 05/01/2018    Speciality Comments: No specialty comments available.  Procedures:  No procedures performed Allergies: Iohexol and Iodinated diagnostic agents    Assessment / Plan:     Visit Diagnoses:  Ankylosing spondylitis of multiple sites in spine [facility]): He presents today with increased upper lumbar midline spinal tenderness for the past 3 weeks.  He has not had any recent injuries or falls. He describes the pain as a dull ache.  He has no radiating pain or radiculopathy.  X-rays of the lumbar spine were ordered today.  He has pain in both hands intermittently but he has no synovitis on exam.  He has no SI joint tenderness.  No achilles tendonitis or plantar fasciitis.  He has been injecting Enbrel 50 mg sq once weekly.  He has not missed any doses of Enbrel recently.  He will continue on this current treatment regimen.  He does not need any refills today.  He was advised to notify us if he develops increased joint pain or joint swelling.  He will follow up in 5 months.   High risk medication use - Enbrel SureClick 50 mg every 7 days.  [facility] and will monitor yearly.TB gold order placed. Most recent [facility]  Due for CBC/CMP today and will monitor every 3 months.  Standing orders in place.  He received the flu vaccine in October and is up-to-date with pneumonia vaccines.    - Plan: CBC with Differential/Platelet, COMPLETE METABOLIC PANEL WITH GFR, QuantiFERON-TB Gold Plus  DDD (degenerative disc disease), cervical: He has good ROM with no discomfort.  She has no symptoms of radiculopathy at this time.   Chronic midline low back pain without sciatica -He presents today with increased upper lumbar midline spinal tenderness was started 3 weeks ago.  He denies any recent injuries or falls.  He describes the pain as a dull ache.  He denies any radiating pain or radiculopathy.  X-rays of the lumbar spine were obtained today.  No acute abnormalities were noted.  He declined ordering an MRI at this time.  He was advised to notify us if he develops any new or worsening symptoms plan: XR Lumbar Spine 2-3 Views  Non-traumatic compression  fracture of twelfth thoracic vertebra, sequela  Trochanteric bursitis of left hip: Resolved   Tendinopathy of left shoulder: He has good ROM with no discomfort.    Other medical conditions are listed as follows:   Neuropathy  Diverticulitis - He was diagnosed with diverticulitis on 12/25/18.  History of kidney stones      Orders: Orders Placed This Encounter  Procedures   XR Lumbar Spine 2-3 Views   CBC with Differential/Platelet   COMPLETE METABOLIC PANEL WITH GFR   QuantiFERON-TB Gold Plus   No orders of the defined types were placed in this encounter.   Face-to-face time spent with patient was 30 minutes. Greater than 50% of time was spent in counseling and coordination of care.  Follow-Up Instructions: Return in about 5 months (around 01/05/2020) for Ankylosing Spondylitis, DDD.   Gearldine Bienenstockaylor M Mylez Venable, PA-C   I examined and evaluated the patient with Sherron Alesaylor Kamariya Blevens PA.  Patient complains of discomfort in the lumbar region for the last couple of weeks.  He had no point tenderness.  X-ray of the lumbar spine was consistent with ankylosing spondylitis with syndesmophytes and fused spine.  He also has fused SI joints.  The pain could be muscular.  I offered MRI for evaluation but she declined.  At this point he would like to contact us  in case the symptoms get worse.  His ankylosing spondylitis is very well controlled with the medication and exercises.  The plan of care was discussed as noted above.  Pollyann SavoyShaili Deveshwar, MD  Note - This record has been created using Animal [employer]agon software.  Chart creation errors have been sought, but may not always  have been located. Such creation errors do not reflect on  the standard of medical care.

## 2019-08-05 ENCOUNTER — Ambulatory Visit (INDEPENDENT_AMBULATORY_CARE_PROVIDER_SITE_OTHER): Payer: Medicare Other

## 2019-08-05 ENCOUNTER — Other Ambulatory Visit: Payer: Self-pay

## 2019-08-05 ENCOUNTER — Ambulatory Visit (INDEPENDENT_AMBULATORY_CARE_PROVIDER_SITE_OTHER): Payer: Medicare Other | Admitting: Rheumatology

## 2019-08-05 ENCOUNTER — Encounter: Payer: Self-pay | Admitting: Rheumatology

## 2019-08-05 VITALS — BP 113/80 | HR 78 | Resp 14 | Ht 72.0 in | Wt 200.0 lb

## 2019-08-05 DIAGNOSIS — M545 Low back pain: Secondary | ICD-10-CM

## 2019-08-05 DIAGNOSIS — M7062 Trochanteric bursitis, left hip: Secondary | ICD-10-CM | POA: Diagnosis not present

## 2019-08-05 DIAGNOSIS — G8929 Other chronic pain: Secondary | ICD-10-CM

## 2019-08-05 DIAGNOSIS — M4854XS Collapsed vertebra, not elsewhere classified, thoracic region, sequela of fracture: Secondary | ICD-10-CM

## 2019-08-05 DIAGNOSIS — M67912 Unspecified disorder of synovium and tendon, left shoulder: Secondary | ICD-10-CM

## 2019-08-05 DIAGNOSIS — M5030 Other cervical disc degeneration, unspecified cervical region: Secondary | ICD-10-CM | POA: Diagnosis not present

## 2019-08-05 DIAGNOSIS — K5792 Diverticulitis of intestine, part unspecified, without perforation or abscess without bleeding: Secondary | ICD-10-CM

## 2019-08-05 DIAGNOSIS — Z79899 Other long term (current) drug therapy: Secondary | ICD-10-CM

## 2019-08-05 DIAGNOSIS — M450 Ankylosing spondylitis of multiple sites in spine: Secondary | ICD-10-CM

## 2019-08-05 DIAGNOSIS — Z87442 Personal history of urinary calculi: Secondary | ICD-10-CM

## 2019-08-05 DIAGNOSIS — G629 Polyneuropathy, unspecified: Secondary | ICD-10-CM

## 2019-08-06 NOTE — Progress Notes (Signed)
CBC and CMP WNL

## 2019-08-07 LAB — COMPLETE METABOLIC PANEL WITH GFR
AG Ratio: 1.6 (calc) (ref 1.0–2.5)
ALT: 28 U/L (ref 9–46)
AST: 16 U/L (ref 10–40)
Albumin: 4.4 g/dL (ref 3.6–5.1)
Alkaline phosphatase (APISO): 94 U/L (ref 36–130)
BUN: 15 mg/dL (ref 7–25)
CO2: 25 mmol/L (ref 20–32)
Calcium: 9.4 mg/dL (ref 8.6–10.3)
Chloride: 102 mmol/L (ref 98–110)
Creat: 0.99 mg/dL (ref 0.60–1.35)
GFR, Est African American: 104 mL/min/{1.73_m2} (ref >=60)
GFR, Est Non African American: 90 mL/min/{1.73_m2} (ref >=60)
Globulin: 2.7 g/dL (calc) (ref 1.9–3.7)
Glucose, Bld: 93 mg/dL (ref 65–99)
Potassium: 4.5 mmol/L (ref 3.5–5.3)
Sodium: 137 mmol/L (ref 135–146)
Total Bilirubin: 0.5 mg/dL (ref 0.2–1.2)
Total Protein: 7.1 g/dL (ref 6.1–8.1)

## 2019-08-07 LAB — CBC WITH DIFFERENTIAL/PLATELET
Absolute Monocytes: 462 cells/uL (ref 200–950)
Basophils Absolute: 53 cells/uL (ref 0–200)
Basophils Relative: 0.8 %
Eosinophils Absolute: 59 cells/uL (ref 15–500)
Eosinophils Relative: 0.9 %
HCT: 46.5 % (ref 38.5–50.0)
Hemoglobin: 15.7 g/dL (ref 13.2–17.1)
Lymphs Abs: 1980 cells/uL (ref 850–3900)
MCH: 32.6 pg (ref 27.0–33.0)
MCHC: 33.8 g/dL (ref 32.0–36.0)
MCV: 96.7 fL (ref 80.0–100.0)
MPV: 10 fL (ref 7.5–12.5)
Monocytes Relative: 7 %
Neutro Abs: 4046 cells/uL (ref 1500–7800)
Neutrophils Relative %: 61.3 %
Platelets: 255 10*3/uL (ref 140–400)
RBC: 4.81 10*6/uL (ref 4.20–5.80)
RDW: 11.9 % (ref 11.0–15.0)
Total Lymphocyte: 30 %
WBC: 6.6 10*3/uL (ref 3.8–10.8)

## 2019-08-07 LAB — QUANTIFERON-TB GOLD PLUS
Mitogen-NIL: >10 IU/mL
NIL: 0.04 IU/mL
QuantiFERON-TB Gold Plus: NEGATIVE
TB1-NIL: 0.01 IU/mL
TB2-NIL: 0.01 IU/mL

## 2019-08-07 NOTE — Progress Notes (Signed)
TB gold negative

## 2019-09-10 ENCOUNTER — Other Ambulatory Visit: Payer: Self-pay | Admitting: Rheumatology

## 2019-09-10 NOTE — Telephone Encounter (Signed)
Last Visit: 08/05/19 Next Visit: 01/06/20 Labs: 08/05/19 WNL TB Gold: 08/05/19 Neg   Okay to refill per Dr. Estanislado Pandy

## 2019-09-17 MED FILL — ENBREL SURECLICK 50 MG/ML S: 50 | 84 days supply | Qty: 12 | Fill #0

## 2019-10-23 ENCOUNTER — Other Ambulatory Visit: Payer: Self-pay | Admitting: Rheumatology

## 2019-10-23 NOTE — Telephone Encounter (Signed)
Last Visit: 08/05/19 Next Visit: 01/06/20   Okay to refill per Dr. Estanislado Pandy

## 2019-11-06 ENCOUNTER — Telehealth: Payer: Self-pay

## 2019-11-06 NOTE — Telephone Encounter (Signed)
Received labs results via fax (they are available in [place] from [facility]).   11/04/2019 UA- Blood in urine from known kidney stone issue Lipid panel- total cholesterol 225, triglycerides 179, LDL cholesterol 149, total chol/HDL chol 5.6 TSH- WNL   CBC, CMP- elevated glucose, all other labs WNL.   Reviewed by Hazel Sams, PA-C.

## 2019-12-08 ENCOUNTER — Other Ambulatory Visit: Payer: Self-pay | Admitting: Rheumatology

## 2019-12-09 NOTE — Telephone Encounter (Signed)
Last Visit:08/05/19 Next Visit:01/06/20 Labs: 11/04/19 CBC, CMP- elevated glucose, all other labs WNL. TB Gold: 08/05/19 WNL  Okay to refill per Dr. Corliss Skains

## 2019-12-15 MED FILL — ENBREL SURECLICK 50 MG/ML S: 50 | 84 days supply | Qty: 12 | Fill #0

## 2019-12-29 NOTE — Progress Notes (Signed)
Virtual Visit via Telephone Note  I connected with Benjamin Wiggins on 12/31/19 at  9:15 AM EST by telephone and verified that I am speaking with the correct person using two identifiers.  Location: Patient: Home Provider: Clinic   This service was conducted via virtual visit.  The patient was located at home. I was located in my office.  Consent was obtained prior to the virtual visit and is aware of possible charges through their insurance for this visit.  The patient is an established patient.  Dr. Estanislado Pandy, MD conducted the virtual visit and Hazel Sams, PA-C acted as scribe during the service.  Office staff helped with scheduling follow up visits after the service was conducted.   I discussed the limitations, risks, security and privacy concerns of performing an evaluation and management service by telephone and the availability of in person appointments. I also discussed with the patient that there may be a patient responsible charge related to this service. The patient expressed understanding and agreed to proceed.  CC: Lower back pain  History of Present Illness: Patient is a 49 year old male with a past medical history of ankylosing spondylitis and DDD.  He is on Enbrel 50 mg sq injections once weekly. He has not missed any doses recently.  He has been experiencing increased nocturnal pain and stiffness in his lower back. He states he got a recliner for christmas and has been sleeping in it and is unsure if it is contributing to his increased discomfort. He has also been experiencing plantar fasciitis bilaterally.   Review of Systems  Constitutional: Positive for malaise/fatigue.  HENT: Positive for congestion.   Eyes: Negative for pain.  Respiratory: Negative for shortness of breath.   Cardiovascular: Negative for leg swelling.  Gastrointestinal: Negative for constipation.  Genitourinary: Negative for frequency.  Musculoskeletal: Positive for joint pain.  Skin: Positive for  rash.  Neurological: Positive for weakness.  Endo/Heme/Allergies: Bruises/bleeds easily.  Psychiatric/Behavioral: Negative for memory loss.      Observations/Objective: Physical Exam  Constitutional: He is oriented to person, place, and time.  Neurological: He is alert and oriented to person, place, and time.  Psychiatric: Mood, memory, affect and judgment normal.   Patient reports morning stiffness for 1 hour.   Patient reports nocturnal pain.  Difficulty dressing/grooming: Reports Difficulty climbing stairs: Reports Difficulty getting out of chair: Denies Difficulty using hands for taps, buttons, cutlery, and/or writing: Reports   Assessment and Plan: Visit Diagnoses: Ankylosing spondylitis of multiple sites in spine [facility]): He has not had any signs or symptoms of a flare.  He has been experiencing increased stiffness and nocturnal pain in his lower back, which he attributes to sleeping in a new recliner.  We discussed trying to not sleep in the recliner for a prolonged period of time due to his spine being fused. He is not having any other joint pain or joint inflammation at this time. He has intermittent symptoms of plantar fasciitis bilaterally, so we discussed the importance of performing regular stretching exercises and the need for proper fitting shoes. He is clinically doing well on Enbrel 50 mg sq injections once weekly. He has not missed any doses recently. He will continue on the current treatment regimen. He was advised to notify us if he develops any new or worsening symptoms. He will follow up in 4-5 months.     High risk medication use - Enbrel SureClick 50 mg every 7 days.  CBC and CMP were drawn on 11/05/19.  He will be due to update lab work in March and every 3 months. TB gold negative on 08/05/19.  He was encouraged to receive the covid-19 vaccine once it is available.   DDD (degenerative disc disease), cervical: He is not having any increased neck pain or stiffness at  this time.  He has no symptoms of radiculopathy.   Non-traumatic compression fracture of twelfth thoracic vertebra, sequela: He has not had any recent fractures.   Trochanteric bursitis of left hip: Resolved   Tendinopathy of left shoulder: He has no discomfort at this time.   Other medical conditions are listed as follows:   Neuropathy  Diverticulitis - He was diagnosed with diverticulitis on 12/25/18.  History of kidney stones  Follow Up Instructions: He will follow up in 4-5 months.   I discussed the assessment and treatment plan with the patient. The patient was provided an opportunity to ask questions and all were answered. The patient agreed with the plan and demonstrated an understanding of the instructions.   The patient was advised to call back or seek an in-person evaluation if the symptoms worsen or if the condition fails to improve as anticipated.  I provided 15 minutes of non-face-to-face time during this encounter.   Pollyann Savoy, MD   Scribed byLadona Ridgel Dale,PA-C

## 2019-12-31 ENCOUNTER — Encounter: Payer: Self-pay | Admitting: Rheumatology

## 2019-12-31 ENCOUNTER — Telehealth (INDEPENDENT_AMBULATORY_CARE_PROVIDER_SITE_OTHER): Payer: Medicare Other | Admitting: Rheumatology

## 2019-12-31 ENCOUNTER — Other Ambulatory Visit: Payer: Self-pay

## 2019-12-31 DIAGNOSIS — Z87442 Personal history of urinary calculi: Secondary | ICD-10-CM

## 2019-12-31 DIAGNOSIS — Z79899 Other long term (current) drug therapy: Secondary | ICD-10-CM | POA: Diagnosis not present

## 2019-12-31 DIAGNOSIS — M7062 Trochanteric bursitis, left hip: Secondary | ICD-10-CM

## 2019-12-31 DIAGNOSIS — K5792 Diverticulitis of intestine, part unspecified, without perforation or abscess without bleeding: Secondary | ICD-10-CM

## 2019-12-31 DIAGNOSIS — M67912 Unspecified disorder of synovium and tendon, left shoulder: Secondary | ICD-10-CM

## 2019-12-31 DIAGNOSIS — M5030 Other cervical disc degeneration, unspecified cervical region: Secondary | ICD-10-CM | POA: Diagnosis not present

## 2019-12-31 DIAGNOSIS — Z87891 Personal history of nicotine dependence: Secondary | ICD-10-CM

## 2019-12-31 DIAGNOSIS — G629 Polyneuropathy, unspecified: Secondary | ICD-10-CM

## 2019-12-31 DIAGNOSIS — M450 Ankylosing spondylitis of multiple sites in spine: Secondary | ICD-10-CM | POA: Diagnosis not present

## 2020-01-06 ENCOUNTER — Ambulatory Visit: Payer: Medicare Other | Admitting: Rheumatology

## 2020-02-22 ENCOUNTER — Other Ambulatory Visit: Payer: Self-pay | Admitting: Rheumatology

## 2020-02-23 NOTE — Telephone Encounter (Signed)
Last Visit: 12/31/19 Next Visit: 05/31/20  Okay to refill per Dr. Corliss Skains

## 2020-03-08 ENCOUNTER — Other Ambulatory Visit: Payer: Self-pay | Admitting: Rheumatology

## 2020-03-08 NOTE — Telephone Encounter (Signed)
Patient requires updated lab work prior to refilling Enbrel.

## 2020-03-08 NOTE — Telephone Encounter (Addendum)
Last Visit: 12/31/19 Next Visit: 05/31/20 Labs:  11/04/19 CBC, CMP- elevated glucose, all other labs WNL. TB Gold: 08/05/19 WNL  Attempted to contact the patient and able to leave a message, mailbox is full.   Okay to refill 30 day supply Enbrel?

## 2020-04-20 ENCOUNTER — Other Ambulatory Visit: Payer: Self-pay | Admitting: Rheumatology

## 2020-04-20 NOTE — Telephone Encounter (Signed)
Last Visit: 12/31/2019 telemedicine  Next Visit: 05/31/2020 Labs: 11/04/2019 (in [place])  TB Gold:  08/05/2019 negative   Attempted to contact patient and no answer/unable to leave message, voicemail box is full.

## 2020-05-17 NOTE — Progress Notes (Signed)
Office Visit Note  Patient: Benjamin Wiggins             Date of Birth: [birth year]           MRN: [id number]             PCP: Loraine Leriche., MD Referring: Loraine Leriche.,* Visit Date: 05/31/2020 Occupation: @GUAROCC @  Subjective:  Medication monitoring   History of Present Illness: Benjamin Wiggins is a 49 y.o. male with history of ankylosing spondylitis and DDD.  He is on Enbrel 50 mg subcutaneous injections once weekly. He has not missed any doses recently. He takes tramadol 50 mg 1 tablet by mouth daily as needed for pain relief.  He has been experiencing intermittent thoracic spinal pain.  He denies any SI joint tenderness.   He has intermittent plantar fascitis bilaterally, R>L.  He denies any achilles tendonitis.    Activities of Daily Living:  Patient reports morning stiffness for 15-30 minutes.   Patient Reports nocturnal pain.  Difficulty dressing/grooming: Reports Difficulty climbing stairs: Denies Difficulty getting out of chair: Denies Difficulty using hands for taps, buttons, cutlery, and/or writing: Denies  Review of Systems  Constitutional: Positive for fatigue. Negative for night sweats.  HENT: Negative for mouth sores, mouth dryness and nose dryness.   Eyes: Negative for redness, itching and dryness.  Respiratory: Negative for shortness of breath and difficulty breathing.   Cardiovascular: Negative for chest pain, palpitations, hypertension, irregular heartbeat and swelling in legs/feet.  Gastrointestinal: Positive for blood in stool. Negative for constipation and diarrhea.  Endocrine: Negative for increased urination.  Genitourinary: Negative for difficulty urinating and painful urination.  Musculoskeletal: Positive for arthralgias, joint pain and morning stiffness. Negative for joint swelling, myalgias, muscle weakness, muscle tenderness and myalgias.  Skin: Negative for color change, rash, hair loss, nodules/bumps, redness, skin  tightness, ulcers and sensitivity to sunlight.  Allergic/Immunologic: Negative for susceptible to infections.  Neurological: Negative for dizziness, fainting, headaches, memory loss and night sweats.  Hematological: Positive for bruising/bleeding tendency. Negative for swollen glands.  Psychiatric/Behavioral: Negative for depressed mood, confusion and sleep disturbance. The patient is not nervous/anxious.     PMFS History:  Patient Active Problem List   Diagnosis Date Noted  . DDD (degenerative disc disease), cervical 10/01/2018  . Ankylosing spondylitis ([place]) 11/07/2016  . High risk medication use 11/07/2016  . Neuropathy 02/23/2016  . Numbness 02/23/2016  . Facial numbness 02/23/2016  . Gait disorder 02/23/2016  . Memory changes 02/23/2016    Past Medical History:  Diagnosis Date  . Ankylosing spondylitis ([place])   . Kidney stones    per patient   . Neuropathy     Family History  Problem Relation Age of Onset  . Diabetes Father   . Heart disease Father   . Neuropathy Neg Hx    Past Surgical History:  Procedure Laterality Date  . ANKLE SURGERY     titatium plate   Social History   Social History Narrative   Lives with Benjamin Wiggins    Caffeine use: 3-4 cups per day   Immunization History  Administered Date(s) Administered  . Influenza-Unspecified 08/22/2017, 09/17/2018  . Pneumococcal Conjugate-13 09/17/2018  . Pneumococcal Polysaccharide-23 08/22/2017     Objective: Vital Signs: BP 119/78 (BP Location: Left Arm, Patient Position: Sitting, Cuff Size: Normal)   Pulse 76   Resp 15   Ht 6' (1.829 m)   Wt 199 lb (90.3 kg)   BMI 26.99 kg/m  Physical Exam Vitals and nursing note reviewed.  Constitutional:      Appearance: He is well-developed.  HENT:     Head: Normocephalic and atraumatic.  Eyes:     Conjunctiva/sclera: Conjunctivae normal.     Pupils: Pupils are equal, round, and reactive to light.  Pulmonary:     Effort: Pulmonary effort is normal.    Abdominal:     General: Bowel sounds are normal.     Palpations: Abdomen is soft.  Musculoskeletal:     Cervical back: Normal range of motion and neck supple.  Skin:    General: Skin is warm and dry.     Capillary Refill: Capillary refill takes less than 2 seconds.  Neurological:     Mental Status: He is alert and oriented to person, place, and time.  Psychiatric:        Behavior: Behavior normal.      Musculoskeletal Exam: C-spine good ROM.  Limited ROM of thoracic and lumbar spine.  Midline spinal tenderness in the thoracic region.  No SI joint tenderness.  Shoulder joints, elbow joints, wrist joints, MCPs, PIPs, and DIPs good ROM with no synovitis.  Complete fist formation bilaterally.  Hip joints, knee joints, and ankle joints good ROM with no synovitis. No warmth or effusion of knee joints.  No tenderness or swelling of ankle joints.   CDAI Exam: CDAI Score: -- Patient Global: --; Provider Global: -- Swollen: --; Tender: -- Joint Exam 05/31/2020   No joint exam has been documented for this visit   There is currently no information documented on the homunculus. Go to the Rheumatology activity and complete the homunculus joint exam.  Investigation: No additional findings.  Imaging: No results found.  Recent Labs: Lab Results  Component Value Date   WBC 6.6 08/05/2019   HGB 15.7 08/05/2019   PLT 255 08/05/2019   NA 137 08/05/2019   K 4.5 08/05/2019   CL 102 08/05/2019   CO2 25 08/05/2019   GLUCOSE 93 08/05/2019   BUN 15 08/05/2019   CREATININE 0.99 08/05/2019   BILITOT 0.5 08/05/2019   ALKPHOS 96 07/18/2017   AST 16 08/05/2019   ALT 28 08/05/2019   PROT 7.1 08/05/2019   ALBUMIN 4.3 07/18/2017   CALCIUM 9.4 08/05/2019   GFRAA 104 08/05/2019   QFTBGOLDPLUS NEGATIVE 08/05/2019    Speciality Comments: No specialty comments available.  Procedures:  No procedures performed Allergies: Iohexol and Iodinated diagnostic agents   Assessment / Plan:     Visit  Diagnoses: Ankylosing spondylitis of multiple sites in spine [facility]): He has not had any recent ankylosing spondylitis flares.  He has intermittent thoracic spinal discomfort.  Midline spinal tenderness in the thoracic region noted.  He has limited ROM of the thoracic and lumbar spine.  He has good ROM of the C-spine with no discomfort.  No SI joint tenderness.  He has intermittent plantar fasciitis bilaterally.  He has no synovitis or dactylitis on exam.  He has clinically been doing well on Enbrel 50 mg subcutaneous injections once weekly.  According to the patient he has not missed any doses of Enbrel recently.  He will continue on the current treatment regimen.  We discussed the importance of regular exercise.  He has been swimming and performing water therapy on a regular basis.  He was advised to notify us if he develops increased joint pain or joint swelling.  He will follow-up in the office in 5 months  High risk medication use - Enbrel SureClick  50 mg every 7 days. CBC will be drawn today.  CMP was drawn on 05/04/2020.  TB gold negative on 05/05/2019.  Future order for TB gold was placed today.  We discussed the importance of yearly skin exams while on Enbrel. He has an upcoming skin exam in August 2021.- Plan: CBC with Differential/Platelet, QuantiFERON-TB Gold Plus  DDD (degenerative disc disease), cervical: He has good ROM of the C-spine with no discomfort.  No symptoms of radiculopathy.   Nontraumatic compression fracture of T12 vertebra, sequela: He has no discomfort at this time.   Trochanteric bursitis of left hip - Resolved   Tendinopathy of left shoulder: He has good ROM with no discomfort.   Screening for tuberculosis - Future order for TB gold placed today. Plan: QuantiFERON-TB Gold Plus  Vitamin D deficiency: History of-Order for vitamin D level was released today.   Other medical conditions are listed as follows:   Neuropathy  Diverticulitis - He was diagnosed with diverticulitis  on 12/25/18.  History of kidney stones   Orders: Orders Placed This Encounter  Procedures  . CBC with Differential/Platelet  . QuantiFERON-TB Gold Plus  . VITAMIN D 25 Hydroxy (Vit-D Deficiency, Fractures)   No orders of the defined types were placed in this encounter.     Follow-Up Instructions: Return in about 5 months (around 10/31/2020) for Ankylosing Spondylitis.   Sherron Ales, PA-C  I examined and evaluated the patient with Sherron Ales PA.  Patient has limited range of motion of his lumbar and thoracic spine.  He had no synovitis on examination.  He states has been taking Enbrel on a weekly basis and has been doing really well.  He has been swimming which has been helpful.  His labs are past due.  We will check labs today.  The plan of care was discussed as noted above.  Pollyann Savoy, MD  Note - This record has been created using Animal nutritionist.  Chart creation errors have been sought, but may not always  have been located. Such creation errors do not reflect on  the standard of medical care.

## 2020-05-31 ENCOUNTER — Other Ambulatory Visit: Payer: Self-pay

## 2020-05-31 ENCOUNTER — Ambulatory Visit (INDEPENDENT_AMBULATORY_CARE_PROVIDER_SITE_OTHER): Payer: Medicare Other | Admitting: Rheumatology

## 2020-05-31 ENCOUNTER — Encounter: Payer: Self-pay | Admitting: Rheumatology

## 2020-05-31 VITALS — BP 119/78 | HR 76 | Resp 15 | Ht 72.0 in | Wt 199.0 lb

## 2020-05-31 DIAGNOSIS — M5030 Other cervical disc degeneration, unspecified cervical region: Secondary | ICD-10-CM

## 2020-05-31 DIAGNOSIS — Z79899 Other long term (current) drug therapy: Secondary | ICD-10-CM | POA: Diagnosis not present

## 2020-05-31 DIAGNOSIS — G629 Polyneuropathy, unspecified: Secondary | ICD-10-CM

## 2020-05-31 DIAGNOSIS — E559 Vitamin D deficiency, unspecified: Secondary | ICD-10-CM

## 2020-05-31 DIAGNOSIS — M7062 Trochanteric bursitis, left hip: Secondary | ICD-10-CM

## 2020-05-31 DIAGNOSIS — M4854XS Collapsed vertebra, not elsewhere classified, thoracic region, sequela of fracture: Secondary | ICD-10-CM

## 2020-05-31 DIAGNOSIS — Z87442 Personal history of urinary calculi: Secondary | ICD-10-CM

## 2020-05-31 DIAGNOSIS — Z111 Encounter for screening for respiratory tuberculosis: Secondary | ICD-10-CM

## 2020-05-31 DIAGNOSIS — M450 Ankylosing spondylitis of multiple sites in spine: Secondary | ICD-10-CM | POA: Diagnosis not present

## 2020-05-31 DIAGNOSIS — K5792 Diverticulitis of intestine, part unspecified, without perforation or abscess without bleeding: Secondary | ICD-10-CM

## 2020-05-31 DIAGNOSIS — M67912 Unspecified disorder of synovium and tendon, left shoulder: Secondary | ICD-10-CM

## 2020-05-31 NOTE — Patient Instructions (Signed)
Standing Labs We placed an order today for your standing lab work.   Please have your standing labs drawn in September and every 3 months   If possible, please have your labs drawn 2 weeks prior to your appointment so that the provider can discuss your results at your appointment.  We have open lab daily Monday through Thursday from 8:30-12:30 PM and 1:30-4:30 PM and Friday from 8:30-12:30 PM and 1:30-4:00 PM at the office of Dr. Shaili Deveshwar, [facility].   You may experience shorter wait times on Monday and Friday afternoons. The office is located at [address], [address], [place], [place] [address] No appointment is necessary.   Labs are drawn by [facility].  You may receive a bill from [facility] for your lab work.  If you wish to have your labs drawn at another location, please call the office 24 hours in advance to send orders.  If you have any questions regarding directions or hours of operation,  please call [phone].   As a reminder, please drink plenty of water prior to coming for your lab work. Thanks!   

## 2020-06-01 LAB — CBC WITH DIFFERENTIAL/PLATELET
Absolute Monocytes: 462 cells/uL (ref 200–950)
Basophils Absolute: 48 cells/uL (ref 0–200)
Basophils Relative: 0.7 %
Eosinophils Absolute: 61 cells/uL (ref 15–500)
Eosinophils Relative: 0.9 %
HCT: 45.2 % (ref 38.5–50.0)
Hemoglobin: 15.8 g/dL (ref 13.2–17.1)
Lymphs Abs: 1700 cells/uL (ref 850–3900)
MCH: 33.4 pg — ABNORMAL HIGH (ref 27.0–33.0)
MCHC: 35 g/dL (ref 32.0–36.0)
MCV: 95.6 fL (ref 80.0–100.0)
MPV: 9.6 fL (ref 7.5–12.5)
Monocytes Relative: 6.8 %
Neutro Abs: 4529 cells/uL (ref 1500–7800)
Neutrophils Relative %: 66.6 %
Platelets: 248 10*3/uL (ref 140–400)
RBC: 4.73 10*6/uL (ref 4.20–5.80)
RDW: 11.8 % (ref 11.0–15.0)
Total Lymphocyte: 25 %
WBC: 6.8 10*3/uL (ref 3.8–10.8)

## 2020-06-01 LAB — VITAMIN D 25 HYDROXY (VIT D DEFICIENCY, FRACTURES): Vit D, 25-Hydroxy: 19 ng/mL — ABNORMAL LOW (ref 30–100)

## 2020-06-01 NOTE — Progress Notes (Signed)
Vitamin D is low.  Please notify the patient and send in vitamin D 50,000 units by mouth twice weekly x3 months.  We will recheck vitamin D in 3 months.   CBC WNL.

## 2020-06-02 ENCOUNTER — Other Ambulatory Visit: Payer: Self-pay | Admitting: *Deleted

## 2020-06-02 DIAGNOSIS — E559 Vitamin D deficiency, unspecified: Secondary | ICD-10-CM

## 2020-06-02 MED ORDER — VITAMIN D (ERGOCALCIFEROL) 1.25 MG (50000 UNIT) PO CAPS: 50000.0000 [IU] | ORAL_CAPSULE | ORAL | 0 refills | Status: DC

## 2020-06-02 NOTE — Telephone Encounter (Signed)
-----   Message from Gearldine Bienenstock, PA-C sent at 06/01/2020  8:09 AM EDT ----- Vitamin D is low.  Please notify the patient and send in vitamin D 50,000 units by mouth twice weekly x3 months.  We will recheck vitamin D in 3 months.   CBC WNL.

## 2020-06-21 ENCOUNTER — Other Ambulatory Visit: Payer: Self-pay | Admitting: Rheumatology

## 2020-06-21 NOTE — Telephone Encounter (Signed)
Last Visit: 05/31/2020 Next Visit: 11/03/2020  Last Fill: 02/23/2020  Okay to refill Tizanidine?

## 2020-09-14 ENCOUNTER — Other Ambulatory Visit: Payer: Self-pay | Admitting: Rheumatology

## 2020-09-14 NOTE — Telephone Encounter (Signed)
Last Visit: 05/31/2020  Next Visit: 11/03/2020  Current Dose per office note on 05/31/2020: not specified.  Last fill: 06/21/2020  Okay to refill tizanidine?

## 2020-10-22 NOTE — Progress Notes (Signed)
Office Visit Note  Patient: Benjamin Wiggins             Date of Birth: 1971/01/05           MRN: [id number]             PCP: Cheron SchaumannVelazquez, Gretchen Y., MD Referring: Cheron SchaumannVelazquez, Gretchen Y.,* Visit Date: 11/03/2020 Occupation: @GUAROCC @  Subjective:  Pain in both hands   History of Present Illness: Benjamin Wiggins is a 49 y.o. male with history of ankylosing spondylitis and DDD.  Patient is on Enbrel 50 mg subcutaneous injections once weekly.  He has not missed any doses of Enbrel recently.  He denies any recent AS flares.  He denies any recent infections.  He states that overall his neck and lower back pain and stiffness has been stable.  He experiences occasional discomfort in both SI joints especially after walking prolonged distances. He denies any increased nocturnal pain or morning stiffness.  He has intermittent thoracic discomfort, and he takes Zanaflex 4 mg by mouth at bedtime as needed for muscle spasms.  He continues to take tramadol as needed for pain relief.  Patient reports that he has been experiencing increased pain and stiffness in both hands.  He is also noticed increased difficulty with fine motor movements.  He has been trying to improve his grip strength.  He tries to remain active.  He has been walking for exercise on a regular basis.   Activities of Daily Living:  Patient reports morning stiffness for 0 minutes.   Patient Reports nocturnal pain.  Difficulty dressing/grooming: Reports Difficulty climbing stairs: Reports Difficulty getting out of chair: Denies Difficulty using hands for taps, buttons, cutlery, and/or writing: Reports  Review of Systems  Constitutional: Positive for fatigue. Negative for night sweats.  HENT: Positive for mouth sores and mouth dryness. Negative for nose dryness.   Eyes: Negative for pain, redness, itching and dryness.  Cardiovascular: Negative for chest pain, palpitations, hypertension, irregular heartbeat and swelling in  legs/feet.  Gastrointestinal: Negative for constipation and diarrhea.  Endocrine: Negative for increased urination.  Genitourinary: Negative for difficulty urinating and painful urination.  Musculoskeletal: Positive for arthralgias, joint pain, myalgias, muscle tenderness and myalgias. Negative for joint swelling, muscle weakness and morning stiffness.  Skin: Negative for color change, rash, hair loss, nodules/bumps, redness, skin tightness, ulcers and sensitivity to sunlight.  Allergic/Immunologic: Negative for susceptible to infections.  Neurological: Negative for dizziness, fainting, numbness, headaches, memory loss and night sweats.  Hematological: Positive for bruising/bleeding tendency. Negative for swollen glands.  Psychiatric/Behavioral: Negative for depressed mood and sleep disturbance. The patient is not nervous/anxious.     PMFS History:  Patient Active Problem List   Diagnosis Date Noted  . DDD (degenerative disc disease), cervical 10/01/2018  . Ankylosing spondylitis (HCC) 11/07/2016  . High risk medication use 11/07/2016  . Neuropathy 02/23/2016  . Numbness 02/23/2016  . Facial numbness 02/23/2016  . Gait disorder 02/23/2016  . Memory changes 02/23/2016    Past Medical History:  Diagnosis Date  . Ankylosing spondylitis (HCC)   . Kidney stones    per patient   . Neuropathy     Family History  Problem Relation Age of Onset  . Diabetes Father   . Heart disease Father   . Neuropathy Neg Hx    Past Surgical History:  Procedure Laterality Date  . ANKLE SURGERY     titatium plate   Social History   Social History Narrative   Lives with  [name]    Caffeine use: 3-4 cups per day   Immunization History  Administered Date(s) Administered  . Influenza-Unspecified 08/22/2017, 09/17/2018  . Pneumococcal Conjugate-13 09/17/2018  . Pneumococcal Polysaccharide-23 08/22/2017     Objective: Vital Signs: BP 105/72 (BP Location: Right Arm, Patient Position:  Sitting, Cuff Size: Normal)   Pulse 73   Resp 17   Ht 6' (1.829 m)   Wt 195 lb 3.2 oz (88.5 kg)   BMI 26.47 kg/m    Physical Exam Vitals and nursing note reviewed.  Constitutional:      Appearance: He is well-developed.  HENT:     Head: Normocephalic and atraumatic.  Eyes:     Conjunctiva/sclera: Conjunctivae normal.     Pupils: Pupils are equal, round, and reactive to light.  Pulmonary:     Effort: Pulmonary effort is normal.  Abdominal:     Palpations: Abdomen is soft.  Musculoskeletal:     Cervical back: Normal range of motion and neck supple.  Skin:    General: Skin is warm and dry.     Capillary Refill: Capillary refill takes less than 2 seconds.  Neurological:     Mental Status: He is alert and oriented to person, place, and time.  Psychiatric:        Behavior: Behavior normal.      Musculoskeletal Exam: C-spine has good range of motion with some limitation with extension and lateral rotation.  Slightly limited range of motion of thoracic and lumbar spine.  Midline spinal tenderness in the lumbar region.  No SI joint tenderness.  Shoulder joints have good range of motion with some stiffness and discomfort bilaterally.  All joints, wrist joints, MCPs, PIPs, DIPs have good range of motion with no synovitis.  He is able to make a complete fist bilaterally.  He has tenderness over the left second, third, and fourth PIP and DIP thickening consistent with osteoarthritis of both hands noted.  Knee joints have good range of motion with no warmth or effusion.  Ankle joints have good range of motion with no discomfort.  Thickening of referring the right ankle noted.  CDAI Exam: CDAI Score: -- Patient Global: --; Provider Global: -- Swollen: --; Tender: -- Joint Exam 11/03/2020   No joint exam has been documented for this visit   There is currently no information documented on the homunculus. Go to the Rheumatology activity and complete the homunculus joint  exam.  Investigation: No additional findings.  Imaging: No results found.  Recent Labs: Lab Results  Component Value Date   WBC 6.8 05/31/2020   HGB 15.8 05/31/2020   PLT 248 05/31/2020   NA 137 08/05/2019   K 4.5 08/05/2019   CL 102 08/05/2019   CO2 25 08/05/2019   GLUCOSE 93 08/05/2019   BUN 15 08/05/2019   CREATININE 0.99 08/05/2019   BILITOT 0.5 08/05/2019   ALKPHOS 96 07/18/2017   AST 16 08/05/2019   ALT 28 08/05/2019   PROT 7.1 08/05/2019   ALBUMIN 4.3 07/18/2017   CALCIUM 9.4 08/05/2019   GFRAA 104 08/05/2019   QFTBGOLDPLUS NEGATIVE 08/05/2019    Speciality Comments: No specialty comments available.  Procedures:  No procedures performed Allergies: Iohexol and Iodinated diagnostic agents   Assessment / Plan:     Visit Diagnoses: Ankylosing spondylitis of multiple sites in spine [facility]): He has slightly limited range of motion of the C-spine, thoracic spine, and lumbar spine on examination today.  Midline spinal tenderness in thoracolumbar region noted.  He has  no SI joint tenderness on examination.  He has not been experiencing any morning stiffness recently.  He is not experiencing any symptoms of radiculopathy at this time.  Overall he is clinically stable on Enbrel 50 mg subcutaneous injections once weekly.  He has not missed any doses of Enbrel recently. He takes tizanidine 4 mg a mouth at bedtime as needed for muscle spasms and tramadol as needed for pain relief.  He continues to exercise and perform stretching exercises on a daily basis.  We discussed the importance of a regular exercise regimen.  He will continue on Enbrel 50 mg subcutaneous injections once weekly.  Refill of Enbrel will be sent to the pharmacy.  He will follow-up in the office in 5 months.  High risk medication use - Enbrel SureClick 50 mg sq injections every 7 days.  CBC was updated on 05/31/2020.  TB gold negative on 08/05/2019.  He is overdue to update CBC, CMP, and TB Gold.  Orders were released.   His next lab work will be due in February and every 3 months to monitor for drug toxicity.  Standing orders for CBC and CMP were placed today. - Plan: CBC with Differential/Platelet, COMPLETE METABOLIC PANEL WITH GFR, QuantiFERON-TB Gold Plus4 He has not had any recent infections.  He has not received the COVID-19 vaccinations and does not plan to at this time.  We discussed that ACR does recommend proceeding with the vaccinations as recommended by the CDC.  He was encouraged to continue to wear his mask and social distance.   Screening for tuberculosis -Order for TB gold was released.  Plan: QuantiFERON-TB Gold Plus  Primary osteoarthritis of both hands: X-rays of both hands were obtained on 10/17/2017 which were consistent with mild osteoarthritic changes.  No erosive changes noted at this time.  He has been experiencing increased pain and stiffness in both hands recently.  He is also noticed increased difficulty with fine motor movements.  We discussed the importance of performing hand exercises on a daily basis.  He was given a handout of these exercises to perform.  We discussed the importance of joint protection and muscle strengthening.  He can use Voltaren gel topically as needed for pain relief.  He was advised to notify us if his hand pain persists or worsens.  He declined updated x-rays today in the office.  Primary osteoarthritis of left knee: X-rays of the left knee were obtained on 10/17/17 which were consistent with moderate osteoarthritis moderate chondromalacia patella.  He experiences intermittent discomfort in both knee joints, especially with strenuous activity.  He has good range of motion in both knee joints on examination today.  No warmth or effusion was noted.   DDD (degenerative disc disease), cervical: He has slightly limited range of motion with lateral rotation and extension of the C-spine.  He is not experiencing any increased neck pain and has no symptoms of radiculopathy.   We discussed importance of performing neck exercises on a regular basis.  Nontraumatic compression fracture of T12 vertebra, sequela: He has chronic midline thoracolumbar spinal tenderness.  No increase in acute pain.  Trochanteric bursitis of left hip - Resolved   Tendinopathy of left shoulder: He has full range of motion of the left shoulder joint on exam with some discomfort and stiffness.  No tenderness or effusion was palpable.  Neuropathy: He takes tramadol as needed for pain relief.  Vitamin D deficiency -He has not been taking vitamin D supplements recently.  Vitamin D level be  checked today.  Plan: VITAMIN D 25 Hydroxy (Vit-D Deficiency, Fractures) Other medical conditions are listed as follows:  History of kidney stones  Diverticulitis - He was diagnosed with diverticulitis on 12/25/18.  Orders: Orders Placed This Encounter  Procedures  . CBC with Differential/Platelet  . COMPLETE METABOLIC PANEL WITH GFR  . VITAMIN D 25 Hydroxy (Vit-D Deficiency, Fractures)  . QuantiFERON-TB Gold Plus  . CBC with Differential/Platelet  . COMPLETE METABOLIC PANEL WITH GFR   No orders of the defined types were placed in this encounter.     Follow-Up Instructions: Return in about 5 months (around 04/03/2021) for Ankylosing Spondylitis.   Gearldine Bienenstock, PA-C  Note - This record has been created using Dragon software.  Chart creation errors have been sought, but may not always  have been located. Such creation errors do not reflect on  the standard of medical care.

## 2020-11-03 ENCOUNTER — Encounter: Payer: Self-pay | Admitting: Physician Assistant

## 2020-11-03 ENCOUNTER — Ambulatory Visit (INDEPENDENT_AMBULATORY_CARE_PROVIDER_SITE_OTHER): Payer: Medicare Other | Admitting: Physician Assistant

## 2020-11-03 ENCOUNTER — Other Ambulatory Visit: Payer: Self-pay

## 2020-11-03 ENCOUNTER — Telehealth: Payer: Self-pay

## 2020-11-03 VITALS — BP 105/72 | HR 73 | Resp 17 | Ht 72.0 in | Wt 195.2 lb

## 2020-11-03 DIAGNOSIS — M1712 Unilateral primary osteoarthritis, left knee: Secondary | ICD-10-CM

## 2020-11-03 DIAGNOSIS — M5030 Other cervical disc degeneration, unspecified cervical region: Secondary | ICD-10-CM

## 2020-11-03 DIAGNOSIS — M4854XS Collapsed vertebra, not elsewhere classified, thoracic region, sequela of fracture: Secondary | ICD-10-CM

## 2020-11-03 DIAGNOSIS — M19041 Primary osteoarthritis, right hand: Secondary | ICD-10-CM

## 2020-11-03 DIAGNOSIS — M450 Ankylosing spondylitis of multiple sites in spine: Secondary | ICD-10-CM

## 2020-11-03 DIAGNOSIS — Z87442 Personal history of urinary calculi: Secondary | ICD-10-CM

## 2020-11-03 DIAGNOSIS — Z79899 Other long term (current) drug therapy: Secondary | ICD-10-CM

## 2020-11-03 DIAGNOSIS — Z111 Encounter for screening for respiratory tuberculosis: Secondary | ICD-10-CM

## 2020-11-03 DIAGNOSIS — K5792 Diverticulitis of intestine, part unspecified, without perforation or abscess without bleeding: Secondary | ICD-10-CM

## 2020-11-03 DIAGNOSIS — G629 Polyneuropathy, unspecified: Secondary | ICD-10-CM

## 2020-11-03 DIAGNOSIS — M7062 Trochanteric bursitis, left hip: Secondary | ICD-10-CM

## 2020-11-03 DIAGNOSIS — M19042 Primary osteoarthritis, left hand: Secondary | ICD-10-CM

## 2020-11-03 DIAGNOSIS — M67912 Unspecified disorder of synovium and tendon, left shoulder: Secondary | ICD-10-CM

## 2020-11-03 DIAGNOSIS — E559 Vitamin D deficiency, unspecified: Secondary | ICD-10-CM

## 2020-11-03 NOTE — Patient Instructions (Addendum)
Hand Exercises Hand exercises can be helpful for almost anyone. These exercises can strengthen the hands, improve flexibility and movement, and increase blood flow to the hands. These results can make work and daily tasks easier. Hand exercises can be especially helpful for people who have joint pain from arthritis or have nerve damage from overuse (carpal tunnel syndrome). These exercises can also help people who have injured a hand. Exercises Most of these hand exercises are gentle stretching and motion exercises. It is usually safe to do them often throughout the day. Warming up your hands before exercise may help to reduce stiffness. You can do this with gentle massage or by placing your hands in warm water for 10-15 minutes. It is normal to feel some stretching, pulling, tightness, or mild discomfort as you begin new exercises. This will gradually improve. Stop an exercise right away if you feel sudden, severe pain or your pain gets worse. Ask your health care provider which exercises are best for you. Knuckle bend or "claw" fist 1. Stand or sit with your arm, hand, and all five fingers pointed straight up. Make sure to keep your wrist straight during the exercise. 2. Gently bend your fingers down toward your palm until the tips of your fingers are touching the top of your palm. Keep your big knuckle straight and just bend the small knuckles in your fingers. 3. Hold this position for __________ seconds. 4. Straighten (extend) your fingers back to the starting position. Repeat this exercise 5-10 times with each hand. Full finger fist 1. Stand or sit with your arm, hand, and all five fingers pointed straight up. Make sure to keep your wrist straight during the exercise. 2. Gently bend your fingers into your palm until the tips of your fingers are touching the middle of your palm. 3. Hold this position for __________ seconds. 4. Extend your fingers back to the starting position, stretching every  joint fully. Repeat this exercise 5-10 times with each hand. Straight fist 1. Stand or sit with your arm, hand, and all five fingers pointed straight up. Make sure to keep your wrist straight during the exercise. 2. Gently bend your fingers at the big knuckle, where your fingers meet your hand, and the middle knuckle. Keep the knuckle at the tips of your fingers straight and try to touch the bottom of your palm. 3. Hold this position for __________ seconds. 4. Extend your fingers back to the starting position, stretching every joint fully. Repeat this exercise 5-10 times with each hand. Tabletop 1. Stand or sit with your arm, hand, and all five fingers pointed straight up. Make sure to keep your wrist straight during the exercise. 2. Gently bend your fingers at the big knuckle, where your fingers meet your hand, as far down as you can while keeping the small knuckles in your fingers straight. Think of forming a tabletop with your fingers. 3. Hold this position for __________ seconds. 4. Extend your fingers back to the starting position, stretching every joint fully. Repeat this exercise 5-10 times with each hand. Finger spread 1. Place your hand flat on a table with your palm facing down. Make sure your wrist stays straight as you do this exercise. 2. Spread your fingers and thumb apart from each other as far as you can until you feel a gentle stretch. Hold this position for __________ seconds. 3. Bring your fingers and thumb tight together again. Hold this position for __________ seconds. Repeat this exercise 5-10 times with each hand.   Making circles 1. Stand or sit with your arm, hand, and all five fingers pointed straight up. Make sure to keep your wrist straight during the exercise. 2. Make a circle by touching the tip of your thumb to the tip of your index finger. 3. Hold for __________ seconds. Then open your hand wide. 4. Repeat this motion with your thumb and each finger on your  hand. Repeat this exercise 5-10 times with each hand. Thumb motion 1. Sit with your forearm resting on a table and your wrist straight. Your thumb should be facing up toward the ceiling. Keep your fingers relaxed as you move your thumb. 2. Lift your thumb up as high as you can toward the ceiling. Hold for __________ seconds. 3. Bend your thumb across your palm as far as you can, reaching the tip of your thumb for the small finger (pinkie) side of your palm. Hold for __________ seconds. Repeat this exercise 5-10 times with each hand. Grip strengthening  1. Hold a stress ball or other soft ball in the middle of your hand. 2. Slowly increase the pressure, squeezing the ball as much as you can without causing pain. Think of bringing the tips of your fingers into the middle of your palm. All of your finger joints should bend when doing this exercise. 3. Hold your squeeze for __________ seconds, then relax. Repeat this exercise 5-10 times with each hand. Contact a health care provider if:  Your hand pain or discomfort gets much worse when you do an exercise.  Your hand pain or discomfort does not improve within 2 hours after you exercise. If you have any of these problems, stop doing these exercises right away. Do not do them again unless your health care provider says that you can. Get help right away if:  You develop sudden, severe hand pain or swelling. If this happens, stop doing these exercises right away. Do not do them again unless your health care provider says that you can. This information is not intended to replace advice given to you by your health care provider. Make sure you discuss any questions you have with your health care provider. Document Revised: 03/13/2019 Document Reviewed: 11/21/2018 Elsevier Patient Education  2020 [employer].   Standing Labs We placed an order today for your standing lab work.   Please have your standing labs drawn in February and every 3 months     If possible, please have your labs drawn 2 weeks prior to your appointment so that the provider can discuss your results at your appointment.  We have open lab daily Monday through Thursday from 8:30-12:30 PM and 1:30-4:30 PM and Friday from 8:30-12:30 PM and 1:30-4:00 PM at the office of Dr. Pollyann Savoy, [facility].   Please be advised, patients with office appointments requiring lab work will take precedents over walk-in lab work.  If possible, please come for your lab work on Monday and Friday afternoons, as you may experience shorter wait times. The office is located at [address], [address], [place], [place] [address] No appointment is necessary.   Labs are drawn by [facility]. Please bring your co-pay at the time of your lab draw.  You may receive a bill from [facility] for your lab work.  If you wish to have your labs drawn at another location, please call the office 24 hours in advance to send orders.  If you have any questions regarding directions or hours of operation,  please call [phone].  As a reminder, please drink plenty of water prior to coming for your lab work. Thanks!

## 2020-11-03 NOTE — Telephone Encounter (Signed)
Please refill enbrel pending lab results, per Taylor Dale, PA-C. Thanks!  

## 2020-11-04 NOTE — Telephone Encounter (Signed)
CBC/ CMP WNL. Awaiting TB Gold results.

## 2020-11-04 NOTE — Progress Notes (Signed)
CBC and CMP WNL. Vitamin D is WNL-33.  Please advise the patient to continue on a maintenance dose of vitamin D 2,000 units daily.

## 2020-11-05 LAB — COMPLETE METABOLIC PANEL WITH GFR
AG Ratio: 1.5 (calc) (ref 1.0–2.5)
ALT: 26 U/L (ref 9–46)
AST: 18 U/L (ref 10–40)
Albumin: 4.3 g/dL (ref 3.6–5.1)
Alkaline phosphatase (APISO): 95 U/L (ref 36–130)
BUN: 14 mg/dL (ref 7–25)
CO2: 31 mmol/L (ref 20–32)
Calcium: 9.8 mg/dL (ref 8.6–10.3)
Chloride: 101 mmol/L (ref 98–110)
Creat: 0.89 mg/dL (ref 0.60–1.35)
GFR, Est African American: 116 mL/min/{1.73_m2} (ref >=60)
GFR, Est Non African American: 100 mL/min/{1.73_m2} (ref >=60)
Globulin: 2.9 g/dL (calc) (ref 1.9–3.7)
Glucose, Bld: 99 mg/dL (ref 65–99)
Potassium: 4.9 mmol/L (ref 3.5–5.3)
Sodium: 138 mmol/L (ref 135–146)
Total Bilirubin: 0.6 mg/dL (ref 0.2–1.2)
Total Protein: 7.2 g/dL (ref 6.1–8.1)

## 2020-11-05 LAB — CBC WITH DIFFERENTIAL/PLATELET
Absolute Monocytes: 490 cells/uL (ref 200–950)
Basophils Absolute: 62 cells/uL (ref 0–200)
Basophils Relative: 0.9 %
Eosinophils Absolute: 90 cells/uL (ref 15–500)
Eosinophils Relative: 1.3 %
HCT: 45.9 % (ref 38.5–50.0)
Hemoglobin: 15.7 g/dL (ref 13.2–17.1)
Lymphs Abs: 1539 cells/uL (ref 850–3900)
MCH: 32.5 pg (ref 27.0–33.0)
MCHC: 34.2 g/dL (ref 32.0–36.0)
MCV: 95 fL (ref 80.0–100.0)
MPV: 9.8 fL (ref 7.5–12.5)
Monocytes Relative: 7.1 %
Neutro Abs: 4720 cells/uL (ref 1500–7800)
Neutrophils Relative %: 68.4 %
Platelets: 265 10*3/uL (ref 140–400)
RBC: 4.83 10*6/uL (ref 4.20–5.80)
RDW: 11.8 % (ref 11.0–15.0)
Total Lymphocyte: 22.3 %
WBC: 6.9 10*3/uL (ref 3.8–10.8)

## 2020-11-05 LAB — QUANTIFERON-TB GOLD PLUS
Mitogen-NIL: >10 IU/mL
NIL: 0.03 IU/mL
QuantiFERON-TB Gold Plus: NEGATIVE
TB1-NIL: 0.01 IU/mL
TB2-NIL: 0 IU/mL

## 2020-11-05 LAB — VITAMIN D 25 HYDROXY (VIT D DEFICIENCY, FRACTURES): Vit D, 25-Hydroxy: 33 ng/mL (ref 30–100)

## 2020-11-08 ENCOUNTER — Other Ambulatory Visit: Payer: Self-pay | Admitting: Physician Assistant

## 2020-11-08 ENCOUNTER — Telehealth: Payer: Self-pay | Admitting: *Deleted

## 2020-11-08 MED ORDER — ENBREL SURECLICK 50 MG/ML ~~LOC~~ SOAJ: SUBCUTANEOUS | 0 refills | Status: DC

## 2020-11-08 MED FILL — ENBREL SURECLICK 50 MG/ML S: 50 | 84 days supply | Qty: 12 | Fill #0

## 2020-11-08 NOTE — Telephone Encounter (Signed)
Tb Gold: Negative

## 2020-11-08 NOTE — Telephone Encounter (Signed)
Per note on 11/03/2020: Patient is on Enbrel 50 mg subcutaneous injections once weekly.  He has not missed any doses of Enbrel recently.  According to Rachael the patient has not had a refill since 12/2019.   Would yo like to refill the Enbrel prescription or have patient come to the office for a restart?

## 2020-11-08 NOTE — Telephone Encounter (Signed)
Prescription sent to the pharmacy and Rachael aware okay to fill prescription.

## 2020-11-08 NOTE — Telephone Encounter (Signed)
Ok for him to continue on weekly enbrel injections at home.

## 2020-11-08 NOTE — Telephone Encounter (Signed)
Patient states he last injection was on 11/02/2020. Patient states he was sick a few times and had to hold the Enbrel and that's how he had the extra pens.

## 2020-11-08 NOTE — Progress Notes (Signed)
TB gold negative

## 2020-11-08 NOTE — Telephone Encounter (Signed)
Please call the patient to clarify when his last dose of enbrel was injected.  If it has been longer than 3 months please schedule restart visit.

## 2020-12-06 ENCOUNTER — Telehealth: Payer: Self-pay | Admitting: Rheumatology

## 2020-12-06 NOTE — Telephone Encounter (Signed)
Patient states his symptoms started on 12/03/2020. Patient states he had his last Enbrel on 12/02/2020. Patient has a copy of his positive test results. Patient advised to hold his Enbrel for 2-3 weeks after symptoms resolved. Patient expressed understanding.   Left message for [facility] to contact patient to set up the monoclonal antibody infusion.

## 2020-12-06 NOTE — Telephone Encounter (Signed)
Patient found out he tested positive for COVID today. Patient took Enbrel last Thursday. Please call to advise on how to proceed.

## 2020-12-08 ENCOUNTER — Telehealth: Payer: Self-pay

## 2020-12-08 NOTE — Telephone Encounter (Signed)
Patient advised we called and left the information with the monoclonal antibody infusion center. Patient advised it may take 24-48 hours or longer for them to contact him. Patient provided with the number to the department of health and human services [phone]).

## 2020-12-08 NOTE — Telephone Encounter (Signed)
Patient left a voicemail stating he spoke with Sue Lush a couple of days ago after testing positive for COVID.  Patient states he was told she would try to schedule antibody infusion at the hospital.  Patient states he hasn't heard anything from anybody and he is getting a little anxious.  Patient requested a return call.

## 2021-01-03 ENCOUNTER — Other Ambulatory Visit: Payer: Self-pay | Admitting: Rheumatology

## 2021-01-25 ENCOUNTER — Other Ambulatory Visit: Payer: Self-pay | Admitting: Rheumatology

## 2021-01-25 ENCOUNTER — Other Ambulatory Visit: Payer: Self-pay | Admitting: Physician Assistant

## 2021-01-25 NOTE — Telephone Encounter (Signed)
Last Visit: 11/03/2020 Next Visit: 04/05/3021 Labs: 11/03/2020, CBC and CMP WNL. Vitamin D is WNL-33. Please advise the patient to continue on a maintenance dose of vitamin D 2,000 units daily.  TB Gold: 11/03/2020, negative  Current Dose per office note 11/03/2020, Enbrel SureClick 50 mg sq injections every 7 days  DX:Ankylosing spondylitis of multiple sites in spine   Last Fill: 11/08/2020  Okay to refill Enbrel?

## 2021-02-02 MED FILL — ENBREL SURECLICK 50 MG/ML S: 50 | 84 days supply | Qty: 12 | Fill #0

## 2021-02-25 ENCOUNTER — Other Ambulatory Visit (HOSPITAL_COMMUNITY): Payer: Self-pay

## 2021-03-22 NOTE — Progress Notes (Deleted)
Office Visit Note  Patient: Benjamin Wiggins             Date of Birth: 1971/02/25           MRN: [id number]             PCP: Cheron Schaumann., MD Referring: Cheron Schaumann.,* Visit Date: 04/05/2021 Occupation: @GUAROCC @  Subjective:  No chief complaint on file.   History of Present Illness: Benjamin Wiggins is a 50 y.o. male ***   Activities of Daily Living:  Patient reports morning stiffness for *** {minute/hour:19697}.   Patient {ACTIONS;DENIES/REPORTS:21021675::"Denies"} nocturnal pain.  Difficulty dressing/grooming: {ACTIONS;DENIES/REPORTS:21021675::"Denies"} Difficulty climbing stairs: {ACTIONS;DENIES/REPORTS:21021675::"Denies"} Difficulty getting out of chair: {ACTIONS;DENIES/REPORTS:21021675::"Denies"} Difficulty using hands for taps, buttons, cutlery, and/or writing: {ACTIONS;DENIES/REPORTS:21021675::"Denies"}  No Rheumatology ROS completed.   PMFS History:  Patient Active Problem List   Diagnosis Date Noted  . DDD (degenerative disc disease), cervical 10/01/2018  . Ankylosing spondylitis (HCC) 11/07/2016  . High risk medication use 11/07/2016  . Neuropathy 02/23/2016  . Numbness 02/23/2016  . Facial numbness 02/23/2016  . Gait disorder 02/23/2016  . Memory changes 02/23/2016    Past Medical History:  Diagnosis Date  . Ankylosing spondylitis (HCC)   . Kidney stones    per patient   . Neuropathy     Family History  Problem Relation Age of Onset  . Diabetes Father   . Heart disease Father   . Neuropathy Neg Hx    Past Surgical History:  Procedure Laterality Date  . ANKLE SURGERY     titatium plate   Social History   Social History Narrative   Lives with 02/25/2016    Caffeine use: 3-4 cups per day   Immunization History  Administered Date(s) Administered  . Influenza-Unspecified 08/22/2017, 09/17/2018  . Pneumococcal Conjugate-13 09/17/2018  . Pneumococcal Polysaccharide-23 08/22/2017     Objective: Vital  Signs: There were no vitals taken for this visit.   Physical Exam   Musculoskeletal Exam: ***  CDAI Exam: CDAI Score: -- Patient Global: --; Provider Global: -- Swollen: --; Tender: -- Joint Exam 04/05/2021   No joint exam has been documented for this visit   There is currently no information documented on the homunculus. Go to the Rheumatology activity and complete the homunculus joint exam.  Investigation: No additional findings.  Imaging: No results found.  Recent Labs: Lab Results  Component Value Date   WBC 6.9 11/03/2020   HGB 15.7 11/03/2020   PLT 265 11/03/2020   NA 138 11/03/2020   K 4.9 11/03/2020   CL 101 11/03/2020   CO2 31 11/03/2020   GLUCOSE 99 11/03/2020   BUN 14 11/03/2020   CREATININE 0.89 11/03/2020   BILITOT 0.6 11/03/2020   ALKPHOS 96 07/18/2017   AST 18 11/03/2020   ALT 26 11/03/2020   PROT 7.2 11/03/2020   ALBUMIN 4.3 07/18/2017   CALCIUM 9.8 11/03/2020   GFRAA 116 11/03/2020   QFTBGOLDPLUS NEGATIVE 11/03/2020    Speciality Comments: No specialty comments available.  Procedures:  No procedures performed Allergies: Iohexol and Iodinated diagnostic agents   Assessment / Plan:     Visit Diagnoses: No diagnosis found.  Orders: No orders of the defined types were placed in this encounter.  No orders of the defined types were placed in this encounter.   Face-to-face time spent with patient was *** minutes. Greater than 50% of time was spent in counseling and coordination of care.  Follow-Up Instructions: No follow-ups on file.  Earnestine Mealing, CMA  Note - This record has been created using Editor, commissioning.  Chart creation errors have been sought, but may not always  have been located. Such creation errors do not reflect on  the standard of medical care.

## 2021-04-05 ENCOUNTER — Ambulatory Visit: Payer: Medicare Other | Admitting: Rheumatology

## 2021-04-05 DIAGNOSIS — M4854XS Collapsed vertebra, not elsewhere classified, thoracic region, sequela of fracture: Secondary | ICD-10-CM

## 2021-04-05 DIAGNOSIS — Z79899 Other long term (current) drug therapy: Secondary | ICD-10-CM

## 2021-04-05 DIAGNOSIS — M5030 Other cervical disc degeneration, unspecified cervical region: Secondary | ICD-10-CM

## 2021-04-05 DIAGNOSIS — K5792 Diverticulitis of intestine, part unspecified, without perforation or abscess without bleeding: Secondary | ICD-10-CM

## 2021-04-05 DIAGNOSIS — E559 Vitamin D deficiency, unspecified: Secondary | ICD-10-CM

## 2021-04-05 DIAGNOSIS — M1712 Unilateral primary osteoarthritis, left knee: Secondary | ICD-10-CM

## 2021-04-05 DIAGNOSIS — M67912 Unspecified disorder of synovium and tendon, left shoulder: Secondary | ICD-10-CM

## 2021-04-05 DIAGNOSIS — M450 Ankylosing spondylitis of multiple sites in spine: Secondary | ICD-10-CM

## 2021-04-05 DIAGNOSIS — Z87442 Personal history of urinary calculi: Secondary | ICD-10-CM

## 2021-04-05 DIAGNOSIS — M19042 Primary osteoarthritis, left hand: Secondary | ICD-10-CM

## 2021-04-05 DIAGNOSIS — G629 Polyneuropathy, unspecified: Secondary | ICD-10-CM

## 2021-04-05 DIAGNOSIS — M7062 Trochanteric bursitis, left hip: Secondary | ICD-10-CM

## 2021-04-26 ENCOUNTER — Other Ambulatory Visit (HOSPITAL_COMMUNITY): Payer: Self-pay

## 2021-04-28 ENCOUNTER — Other Ambulatory Visit: Payer: Self-pay | Admitting: Rheumatology

## 2021-04-28 ENCOUNTER — Other Ambulatory Visit (HOSPITAL_COMMUNITY): Payer: Self-pay

## 2021-05-26 ENCOUNTER — Other Ambulatory Visit (HOSPITAL_COMMUNITY): Payer: Self-pay

## 2021-05-30 ENCOUNTER — Other Ambulatory Visit (HOSPITAL_COMMUNITY): Payer: Self-pay

## 2021-06-13 ENCOUNTER — Other Ambulatory Visit: Payer: Self-pay

## 2021-06-13 ENCOUNTER — Encounter: Payer: Self-pay | Admitting: Orthopaedic Surgery

## 2021-06-13 ENCOUNTER — Telehealth: Payer: Self-pay

## 2021-06-13 ENCOUNTER — Ambulatory Visit (INDEPENDENT_AMBULATORY_CARE_PROVIDER_SITE_OTHER): Payer: Medicare Other | Admitting: Orthopaedic Surgery

## 2021-06-13 ENCOUNTER — Ambulatory Visit (INDEPENDENT_AMBULATORY_CARE_PROVIDER_SITE_OTHER): Payer: Medicare Other

## 2021-06-13 VITALS — BP 139/71 | HR 82 | Ht 72.0 in | Wt 200.0 lb

## 2021-06-13 DIAGNOSIS — M79671 Pain in right foot: Secondary | ICD-10-CM | POA: Diagnosis not present

## 2021-06-13 DIAGNOSIS — S92354A Nondisplaced fracture of fifth metatarsal bone, right foot, initial encounter for closed fracture: Secondary | ICD-10-CM

## 2021-06-13 NOTE — Telephone Encounter (Signed)
Patient called stating his right leg gave out walking up the stairs and he lost his balance and hurt his right foot.  Patient states he now has a bulge on the  middle outside edge of his right foot.  Patient states he cannot put pressure on his foot and noticed increased swelling. Patient states it is a little painful. Please advise if you would like to patient to be seen here or with orthopedics.

## 2021-06-13 NOTE — Telephone Encounter (Signed)
Patient advised to schedule an urgent appt with his orthopedist. Patient will call to schedule an appointment.

## 2021-06-13 NOTE — Progress Notes (Signed)
Office Visit Note   Patient: Benjamin Wiggins           Date of Birth: 09/18/1971           MRN: [id number] Visit Date: 06/13/2021              Requested by: Cheron Schaumann., MD [address] [place],  [place] [address] PCP: Cheron Schaumann., MD   Assessment & Plan: Visit Diagnoses:  1. Pain in right foot   2. Closed nondisplaced fracture of fifth metatarsal bone of right foot, initial encounter     Plan: Cam boot, Crutches.Tramadol for pain.  Recheck 1 month.Repeat x-rays on return.  Follow-Up Instructions: Return in about 1 month (around 07/14/2021).   Orders:  Orders Placed This Encounter  Procedures   XR Foot Complete Right   No orders of the defined types were placed in this encounter.     Procedures: No procedures performed   Clinical Data: No additional findings.   Subjective: Chief Complaint  Patient presents with   Right Foot - Pain    Fall 06/13/2021    50 year old male seen with right foot injury after fall this morning 06/13/2021.  Patient states he has had severe right right lateral foot pain and inability to put weight on his foot.  Has small crutches that do not fit.  He is taken Tylenol without relief.  Diagnosis of ankylosing spondylitis on Enbrel weekly injections.  Patient states occasionally loses his balance or his had falls.  No numbness or tingling in his hands no neurogenic claudication symptoms.  Ankylosis involves lumbar spine and sacroiliac joints but no problems with neck range of motion.  Review of Systems po.  Sitive for ankylosing spondylitis history of occasional falls or losing balance.   Objective: Vital Signs: BP 139/71   Pulse 82   Ht 6' (1.829 m)   Wt 200 lb (90.7 kg)   BMI 27.12 kg/m   Physical Exam Constitutional:      Appearance: He is well-developed.  HENT:     Head: Normocephalic and atraumatic.     Right Ear: External ear normal.     Left Ear: External ear normal.  Eyes:     Pupils: Pupils are  equal, round, and reactive to light.  Neck:     Thyroid: No thyromegaly.     Trachea: No tracheal deviation.  Cardiovascular:     Rate and Rhythm: Normal rate.  Pulmonary:     Effort: Pulmonary effort is normal.     Breath sounds: No wheezing.  Abdominal:     General: Bowel sounds are normal.     Palpations: Abdomen is soft.  Musculoskeletal:     Cervical back: Neck supple.  Skin:    General: Skin is warm and dry.     Capillary Refill: Capillary refill takes less than 2 seconds.  Neurological:     Mental Status: He is alert and oriented to person, place, and time.  Psychiatric:        Behavior: Behavior normal.        Thought Content: Thought content normal.        Judgment: Judgment normal.    Ortho Exam tenderness over the base of the fifth metatarsal right foot.  Pain with resisted peroneal function.  No ecchymosis mild swelling mid lateral foot.  Specialty Comments:  No specialty comments available.  Imaging: No results found.   PMFS History: Patient Active Problem List   Diagnosis Date Noted  Metatarsal fracture 06/14/2021   DDD (degenerative disc disease), cervical 10/01/2018   Ankylosing spondylitis (HCC) 11/07/2016   High risk medication use 11/07/2016   Neuropathy 02/23/2016   Numbness 02/23/2016   Facial numbness 02/23/2016   Gait disorder 02/23/2016   Memory changes 02/23/2016   Past Medical History:  Diagnosis Date   Ankylosing spondylitis (HCC)    Kidney stones    per patient    Neuropathy     Family History  Problem Relation Age of Onset   Diabetes Father    Heart disease Father    Neuropathy Neg Hx     Past Surgical History:  Procedure Laterality Date   ANKLE SURGERY     titatium plate   Social History   Occupational History   Occupation: Unemployed- Disability   Tobacco Use   Smoking status: Former    Packs/day: 0.10    Years: 3.00    Pack years: 0.30    Types: Cigarettes    Quit date: 1993    Years since quitting: 29.5    Smokeless tobacco: Never  Vaping Use   Vaping Use: Never used  Substance and Sexual Activity   Alcohol use: Yes    Alcohol/week: 1.0 standard drink    Types: 1 Glasses of wine per week    Comment: occ   Drug use: No   Sexual activity: Not on file

## 2021-06-13 NOTE — Telephone Encounter (Signed)
Patient called stating his right leg gave out and he lost his balance and hurt his right foot.  Patient states he now has a bulge on the outside edge of his right foot.  Patient states he cannot put pressure on his foot and noticed increased swelling.  Patient requested a return call.

## 2021-06-13 NOTE — Telephone Encounter (Signed)
Please advise the patient to schedule an urgent appt with his orthopedist.

## 2021-06-14 ENCOUNTER — Other Ambulatory Visit: Payer: Self-pay | Admitting: Rheumatology

## 2021-06-14 DIAGNOSIS — S92309A Fracture of unspecified metatarsal bone(s), unspecified foot, initial encounter for closed fracture: Secondary | ICD-10-CM | POA: Insufficient documentation

## 2021-06-14 NOTE — Telephone Encounter (Signed)
Please schedule patient for a follow up visit. Patient was due May 2022. Thanks!  

## 2021-06-14 NOTE — Telephone Encounter (Signed)
Patient has been scheduled for a virtual visit on 07/07/2021. Patient requested virtual due to fracturing 2 bones in his right foot.

## 2021-06-14 NOTE — Telephone Encounter (Signed)
Next Visit: was due May 2022. Message sent to the front to schedule patient.   Last Visit: 11/03/2021   Last Fill:01/03/2021  Current Dose per office note on 11/03/2021: 4 mg a mouth at bedtime as needed for muscle spasms  Okay to refill Tizanidine?

## 2021-07-07 ENCOUNTER — Telehealth (INDEPENDENT_AMBULATORY_CARE_PROVIDER_SITE_OTHER): Payer: Medicare Other | Admitting: Rheumatology

## 2021-07-07 ENCOUNTER — Encounter: Payer: Self-pay | Admitting: Rheumatology

## 2021-07-07 ENCOUNTER — Other Ambulatory Visit: Payer: Self-pay | Admitting: *Deleted

## 2021-07-07 ENCOUNTER — Telehealth: Payer: Medicare Other | Admitting: Rheumatology

## 2021-07-07 ENCOUNTER — Other Ambulatory Visit: Payer: Self-pay

## 2021-07-07 VITALS — Ht 72.0 in

## 2021-07-07 DIAGNOSIS — M19042 Primary osteoarthritis, left hand: Secondary | ICD-10-CM

## 2021-07-07 DIAGNOSIS — G629 Polyneuropathy, unspecified: Secondary | ICD-10-CM

## 2021-07-07 DIAGNOSIS — M450 Ankylosing spondylitis of multiple sites in spine: Secondary | ICD-10-CM

## 2021-07-07 DIAGNOSIS — M1712 Unilateral primary osteoarthritis, left knee: Secondary | ICD-10-CM

## 2021-07-07 DIAGNOSIS — M4854XS Collapsed vertebra, not elsewhere classified, thoracic region, sequela of fracture: Secondary | ICD-10-CM

## 2021-07-07 DIAGNOSIS — M5030 Other cervical disc degeneration, unspecified cervical region: Secondary | ICD-10-CM

## 2021-07-07 DIAGNOSIS — Z79899 Other long term (current) drug therapy: Secondary | ICD-10-CM

## 2021-07-07 DIAGNOSIS — M545 Low back pain, unspecified: Secondary | ICD-10-CM

## 2021-07-07 DIAGNOSIS — M19041 Primary osteoarthritis, right hand: Secondary | ICD-10-CM

## 2021-07-07 DIAGNOSIS — Z87442 Personal history of urinary calculi: Secondary | ICD-10-CM

## 2021-07-07 DIAGNOSIS — K5792 Diverticulitis of intestine, part unspecified, without perforation or abscess without bleeding: Secondary | ICD-10-CM

## 2021-07-07 DIAGNOSIS — Z8781 Personal history of (healed) traumatic fracture: Secondary | ICD-10-CM

## 2021-07-07 DIAGNOSIS — M7062 Trochanteric bursitis, left hip: Secondary | ICD-10-CM

## 2021-07-07 DIAGNOSIS — M67912 Unspecified disorder of synovium and tendon, left shoulder: Secondary | ICD-10-CM

## 2021-07-07 DIAGNOSIS — E559 Vitamin D deficiency, unspecified: Secondary | ICD-10-CM

## 2021-07-07 MED ORDER — ETANERCEPT 50 MG/ML ~~LOC~~ SOAJ
SUBCUTANEOUS | 0 refills | Status: DC
  Filled 2021-07-07: qty 12, fill #0
  Filled 2021-07-08: qty 4, 28d supply, fill #0
  Filled 2021-08-10: qty 4, 28d supply, fill #1
  Filled 2021-09-12: qty 4, 28d supply, fill #2

## 2021-07-07 NOTE — Progress Notes (Signed)
Office Visit Note  Patient: Benjamin Wiggins             Date of Birth: 09-Mar-1971           MRN: [id number]             PCP: Cheron Schaumann., MD Visit Date: 07/07/2021   Virtual Visit via Telephone Note  I connected with Benjamin Wiggins on 07/07/21 at  4:00 PM EDT by telephone and verified that I am speaking with the correct person using two identifiers.   CC: Right foot pain  Subjective:    History of Present Illness: Benjamin Wiggins is a 50 y.o. male with history of ankylosing spondylitis.  He remains on enbrel 50 mg sq injections once weekly.  He denies any recent AS flares.  He states he is "no better and no worse."  He has been less active recently since he fractured his right foot in July.  He states his right leg "gave out" while walking which led to the fracture.  He has been wearing a boot as instructed.  He has been following up with Dr. Ophelia Charter.   He denies any recent infections.   Patient reports morning stiffness for 1 hour.   Patient reports nocturnal pain.  Difficulty dressing/grooming: Reports Difficulty climbing stairs: Reports Difficulty getting out of chair: Denies Difficulty using hands for taps, buttons, cutlery, and/or writing: Reports   Review of Systems  Constitutional:  Positive for fatigue.  HENT:  Positive for mouth dryness.   Eyes:  Positive for dryness.  Respiratory:  Negative for shortness of breath.   Cardiovascular:  Positive for swelling in legs/feet.  Gastrointestinal:  Negative for constipation.  Endocrine: Positive for cold intolerance and heat intolerance.  Genitourinary:  Negative for difficulty urinating.  Musculoskeletal:  Positive for joint pain, gait problem, joint pain, joint swelling, muscle weakness and morning stiffness.  Skin:  Positive for rash.  Allergic/Immunologic: Positive for susceptible to infections.  Neurological:  Positive for numbness and weakness.  Hematological:  Positive for  bruising/bleeding tendency.  Psychiatric/Behavioral:  Positive for sleep disturbance.     Objective: Vital Signs: Ht 6' (1.829 m)   BMI 27.12 kg/m    Physical Exam Neurological:     Mental Status: He is alert and oriented to person, place, and time.  Psychiatric:        Mood and Affect: Mood and affect normal.        Cognition and Memory: Memory normal.        Judgment: Judgment normal.    PMFS History:  Patient Active Problem List   Diagnosis Date Noted   Metatarsal fracture 06/14/2021   DDD (degenerative disc disease), cervical 10/01/2018   Ankylosing spondylitis (HCC) 11/07/2016   High risk medication use 11/07/2016   Neuropathy 02/23/2016   Numbness 02/23/2016   Facial numbness 02/23/2016   Gait disorder 02/23/2016   Memory changes 02/23/2016    Past Medical History:  Diagnosis Date   Ankylosing spondylitis (HCC)    Kidney stones    per patient    Neuropathy     Family History  Problem Relation Age of Onset   Diabetes Father    Heart disease Father    Neuropathy Neg Hx    Past Surgical History:  Procedure Laterality Date   ANKLE SURGERY     titatium plate   Social History   Social History Narrative   Lives with [name]  Caffeine use: 3-4 cups per day     Assessment and Plan: Visit Diagnoses: Ankylosing spondylitis of multiple sites in spine [facility]): Stable.  He has not had any signs or symptoms of a flare recently.  He remains on Enbrel 50 mg sq injections once weekly.  He has not missed any doses recently.  He has been more sedentary recently since fracturing his right foot and being under the care of Dr. Ophelia Charter.  He remains in a cam boot and has been using crutches.  He has a follow up visit scheduled for repeat x-rays on 07/15/21.  He is not experiencing any other increased joint pain or joint swelling at this time. He will remain on the current treatment regimen. A refill of enbrel will be sent to the pharmacy. He will follow up in the office 3-4  months.  High risk medication use - Enbrel SureClick 50 mg sq injections every 7 days. CBC and CMP updated on 05/19/21.  He will be due to update lab work in September and every 3 months to monitor for drug toxicity.  Standing orders for CBC and CMP are in place. TB gold negative on 11/03/20.  He has not had any recent infections.  Advised the patient to hold enbrel if he develops signs or symptoms of an infection and to resume once the infection has completely cleared.  He does not plan to get the covid-19 vaccine at this time.  Discussed the importance of receiving the pneumonia vaccine, yearly influenza vaccine, and shingrix.  He plans on further discussing with his PCP.  Discussed the importance of yearly skin exams.  He sees a Armed forces operational officer on a regular basis.    Screening for tuberculosis -TB gold negative on 11/03/20 and will continue to be monitored yearly.    Primary osteoarthritis of both hands: X-rays of both hands were obtained on 10/17/2017 which were consistent with mild osteoarthritic changes.  No erosive changes noted at this time.  He is not experiencing any pain or joint swelling in his hands at this time.    Primary osteoarthritis of left knee: X-rays of the left knee were obtained on 10/17/17 which were consistent with moderate osteoarthritis moderate chondromalacia patella.  He is not experiencing any increased pain or swelling at this time.   History of foot fracture: Closed nondisplaced fracture of fifth metatarsal bone of right foot. According to the patient his right leg "gave out" while walking, which led to the fracture.  Patient was given a cam boot on 06/13/2021 as well as crutches and tramadol for pain relief.  He will be following back up with Dr. Ophelia Charter for repeat x-rays on 07/15/21.  We will further discuss ordering a DEXA at his follow up visit.   DDD (degenerative disc disease), cervical: He is not experiencing any increased pain or stiffness at this time.    Nontraumatic compression fracture of T12 vertebra, sequela: Healed. We will further discuss ordering a DEXA at his follow up visit.   Trochanteric bursitis of left hip - Resolved   Tendinopathy of left shoulder:Resolved    Neuropathy: He takes tramadol as needed for pain relief.   Vitamin D deficiency: Discussed the importance of taking vitamin D 2000 units daily. We will further discuss ordering a DEXA at his follow up visit.   Other medical conditions are listed as follows:   History of kidney stones   Diverticulitis - He was diagnosed with diverticulitis on 12/25/18.  Note - This record has been created using  Animal nutritionist. Chart creation errors have been sought, but may not always have been located. Such creation errors do not reflect on the standard of medical care.    I discussed the limitations, risks, security and privacy concerns of performing an evaluation and management service by telephone and the availability of in person appointments. I also discussed with the patient that there may be a patient responsible charge related to this service. The patient expressed understanding and agreed to proceed.   Follow Up Instructions: Follow up in 3 months.     I provided 20 minutes of non-face-to-face time during this encounter.  Scribed bySherron Ales, PA-C  I reviewed the above note and I attest to the accuracy of the documentation. Pollyann Savoy, MD

## 2021-07-08 ENCOUNTER — Other Ambulatory Visit (HOSPITAL_COMMUNITY): Payer: Self-pay

## 2021-07-12 ENCOUNTER — Telehealth: Payer: Medicare Other | Admitting: Rheumatology

## 2021-07-15 ENCOUNTER — Other Ambulatory Visit: Payer: Self-pay

## 2021-07-15 ENCOUNTER — Ambulatory Visit (INDEPENDENT_AMBULATORY_CARE_PROVIDER_SITE_OTHER): Payer: Medicare Other

## 2021-07-15 ENCOUNTER — Ambulatory Visit (INDEPENDENT_AMBULATORY_CARE_PROVIDER_SITE_OTHER): Payer: Medicare Other | Admitting: Orthopaedic Surgery

## 2021-07-15 VITALS — BP 117/84 | HR 82 | Ht 72.0 in | Wt 200.0 lb

## 2021-07-15 DIAGNOSIS — S92354A Nondisplaced fracture of fifth metatarsal bone, right foot, initial encounter for closed fracture: Secondary | ICD-10-CM

## 2021-07-15 NOTE — Progress Notes (Signed)
Office Visit Note   Patient: Benjamin Wiggins           Date of Birth: [birth year]           MRN: [id number] Visit Date: 07/15/2021              Requested by: Cheron Schaumann., MD [address] [place],  [place] [address] PCP: Cheron Schaumann., MD   Assessment & Plan: Visit Diagnoses:  1. Closed nondisplaced fracture of fifth metatarsal bone of right foot, initial encounter     Plan: X-rays show partial healing.  We will switch him from the cam boot to the Swede-O.  He can progress with weightbearing as tolerated.  Follow-up if he has persistent symptoms.  Follow-Up Instructions: No follow-ups on file.   Orders:  Orders Placed This Encounter  Procedures   XR Foot Complete Right   No orders of the defined types were placed in this encounter.     Procedures: No procedures performed   Clinical Data: No additional findings.   Subjective: Chief Complaint  Patient presents with   Right Foot - Follow-up    HPI follow-up right fifth closed metatarsal fracture from 06/13/2021 injury date.  He states doing much better he is walking well.  Using crutches cam boot.  Minimal use of tramadol.  Review of Systems positive for AMS all other systems updated unchanged from 06/13/2021 office visit.   Objective: Vital Signs: BP 117/84   Pulse 82   Ht 6' (1.829 m)   Wt 200 lb (90.7 kg)   BMI 27.12 kg/m   Physical Exam Constitutional:      Appearance: He is well-developed.  HENT:     Head: Normocephalic and atraumatic.     Right Ear: External ear normal.     Left Ear: External ear normal.  Eyes:     Pupils: Pupils are equal, round, and reactive to light.  Neck:     Thyroid: No thyromegaly.     Trachea: No tracheal deviation.  Cardiovascular:     Rate and Rhythm: Normal rate.  Pulmonary:     Effort: Pulmonary effort is normal.     Breath sounds: No wheezing.  Abdominal:     General: Bowel sounds are normal.     Palpations: Abdomen is soft.   Musculoskeletal:     Cervical back: Neck supple.  Skin:    General: Skin is warm and dry.     Capillary Refill: Capillary refill takes less than 2 seconds.  Neurological:     Mental Status: He is alert and oriented to person, place, and time.  Psychiatric:        Behavior: Behavior normal.        Thought Content: Thought content normal.        Judgment: Judgment normal.    Ortho Exam slight tenderness base of the fifth.  Peroneals active.  Ecchymosis is resolved.  Specialty Comments:  No specialty comments available.  Imaging: No results found.   PMFS History: Patient Active Problem List   Diagnosis Date Noted   Metatarsal fracture 06/14/2021   DDD (degenerative disc disease), cervical 10/01/2018   Ankylosing spondylitis (HCC) 11/07/2016   High risk medication use 11/07/2016   Neuropathy 02/23/2016   Numbness 02/23/2016   Facial numbness 02/23/2016   Gait disorder 02/23/2016   Memory changes 02/23/2016   Past Medical History:  Diagnosis Date   Ankylosing spondylitis (HCC)    Kidney stones    per patient  Neuropathy     Family History  Problem Relation Age of Onset   Diabetes Father    Heart disease Father    Neuropathy Neg Hx     Past Surgical History:  Procedure Laterality Date   ANKLE SURGERY     titatium plate   Social History   Occupational History   Occupation: Unemployed- Disability   Tobacco Use   Smoking status: Former    Packs/day: 0.10    Years: 3.00    Pack years: 0.30    Types: Cigarettes    Quit date: 1993    Years since quitting: 29.6   Smokeless tobacco: Never  Vaping Use   Vaping Use: Never used  Substance and Sexual Activity   Alcohol use: Yes    Comment: rarely   Drug use: No   Sexual activity: Not on file

## 2021-07-21 ENCOUNTER — Other Ambulatory Visit: Payer: Self-pay | Admitting: Physician Assistant

## 2021-07-21 NOTE — Telephone Encounter (Signed)
Next Visit: Due November 2022. Message sent to the front to schedule.   Last Visit: 07/07/2021  Last Fill: 06/14/2021  Dx: Ankylosing spondylitis of multiple sites in spine   Current Dose per office note on 07/07/2021: not discussed  Okay to refill Tizanidine?

## 2021-08-01 ENCOUNTER — Other Ambulatory Visit (HOSPITAL_COMMUNITY): Payer: Self-pay

## 2021-08-10 ENCOUNTER — Other Ambulatory Visit (HOSPITAL_COMMUNITY): Payer: Self-pay

## 2021-08-22 ENCOUNTER — Encounter: Payer: Self-pay | Admitting: Rheumatology

## 2021-08-22 DIAGNOSIS — M450 Ankylosing spondylitis of multiple sites in spine: Secondary | ICD-10-CM

## 2021-08-22 DIAGNOSIS — Z79899 Other long term (current) drug therapy: Secondary | ICD-10-CM

## 2021-08-22 DIAGNOSIS — E559 Vitamin D deficiency, unspecified: Secondary | ICD-10-CM

## 2021-08-23 MED ORDER — VITAMIN D (ERGOCALCIFEROL) 1.25 MG (50000 UNIT) PO CAPS: 50000.0000 [IU] | ORAL_CAPSULE | ORAL | 0 refills | Status: DC

## 2021-08-23 NOTE — Telephone Encounter (Signed)
Patient states he had a colonoscopy and endoscopy last week. Patient states the GI advised him of possible Celiac Disease. Recommended for him to be tested. Patient has lost over 40 lbs. Patient states it also should mild diverticulosis, internal hemorrhoids and hiatal hernia.  Patient would like to know recommendations are for Enbrel. Patient held medication last week. Patient states he would also like recommendation on getting tested for Celiac diease. Patient states he has ate less gluten the last few day. Patient states he has had a prescription for Vitamin D in the past. Patient last had his Vitamin D checked in June and it was 1. Patient is requesting a prescription for Vitamin D. Please advise.

## 2021-08-23 NOTE — Telephone Encounter (Signed)
Patient advised we are going to send a prescription for vitamin D 50,000 units weekly for 3 months.  Patient advised he should have repeat vitamin D in 3 months.  Patient advised he should maintain on vitamin D 2000 units if the vitamin D level is normal after 3 months. Patient advised he should continue Enbrel as prescribed. Per Dr. Corliss Skains patient may have the following labs drawn, Anti-TTG, Anti Gliadin and IGA. Patient advised per Dr. Corliss Skains he should maintain gluten in his diet for the next two weeks prior to having labs.

## 2021-08-23 NOTE — Telephone Encounter (Signed)
Okay to send a prescription for vitamin D 50,000 units weekly for 3 months.  He should have repeat vitamin D in 3 months.  He should maintain on vitamin D 2000 units if the vitamin D level is normal after 3 months.  He should see his gastroenterologist for the evaluation and treatment of celiac disease.  He should continue Enbrel as prescribed.

## 2021-08-25 ENCOUNTER — Encounter: Payer: Self-pay | Admitting: Rheumatology

## 2021-09-12 ENCOUNTER — Other Ambulatory Visit (HOSPITAL_COMMUNITY): Payer: Self-pay

## 2021-09-15 ENCOUNTER — Other Ambulatory Visit (HOSPITAL_COMMUNITY): Payer: Self-pay

## 2021-10-12 ENCOUNTER — Other Ambulatory Visit (HOSPITAL_COMMUNITY): Payer: Self-pay

## 2021-10-14 ENCOUNTER — Other Ambulatory Visit (HOSPITAL_COMMUNITY): Payer: Self-pay

## 2021-10-14 ENCOUNTER — Other Ambulatory Visit: Payer: Self-pay | Admitting: Physician Assistant

## 2021-10-14 MED ORDER — ENBREL SURECLICK 50 MG/ML ~~LOC~~ SOAJ
SUBCUTANEOUS | 0 refills | Status: DC
  Filled 2021-10-14: qty 12, 84d supply, fill #0

## 2021-10-14 NOTE — Telephone Encounter (Signed)
Next Visit: Due November 2022. Message sent to the front to schedule patient   Last Visit: 07/07/2021  Last Fill: 07/07/2021  DX:Ankylosing spondylitis of multiple sites in spine   Current Dose per office note 85/03/2021: Enbrel SureClick 50 mg sq injections every 7 days.  Labs: 08/10/2021 Glucose 102, MCV 98.1  TB Gold: 11/03/2020 Neg    Okay to refill Enbrel?

## 2021-10-14 NOTE — Telephone Encounter (Signed)
Please schedule patient for a follow up visit. Patient due November 2022. Thanks!  

## 2021-10-18 ENCOUNTER — Other Ambulatory Visit (HOSPITAL_COMMUNITY): Payer: Self-pay

## 2021-10-24 ENCOUNTER — Other Ambulatory Visit (HOSPITAL_COMMUNITY): Payer: Self-pay

## 2021-11-02 ENCOUNTER — Other Ambulatory Visit: Payer: Self-pay | Admitting: Rheumatology

## 2021-11-02 NOTE — Telephone Encounter (Signed)
Next Visit: 11/09/2021   Last Visit: 07/07/2021   Last Fill: 07/21/2021  Current Dose per office note on 07/07/2021: not discussed  Okay to refill Tizanidine?

## 2021-11-09 ENCOUNTER — Ambulatory Visit: Payer: Medicare Other

## 2021-11-09 ENCOUNTER — Encounter: Payer: Self-pay | Admitting: Rheumatology

## 2021-11-09 ENCOUNTER — Telehealth: Payer: Self-pay

## 2021-11-09 ENCOUNTER — Ambulatory Visit (INDEPENDENT_AMBULATORY_CARE_PROVIDER_SITE_OTHER): Payer: Medicare Other | Admitting: Rheumatology

## 2021-11-09 ENCOUNTER — Other Ambulatory Visit: Payer: Self-pay

## 2021-11-09 ENCOUNTER — Other Ambulatory Visit: Payer: Self-pay | Admitting: Rheumatology

## 2021-11-09 VITALS — BP 107/76 | HR 80 | Ht 72.0 in | Wt 178.0 lb

## 2021-11-09 DIAGNOSIS — M19042 Primary osteoarthritis, left hand: Secondary | ICD-10-CM

## 2021-11-09 DIAGNOSIS — H5713 Ocular pain, bilateral: Secondary | ICD-10-CM

## 2021-11-09 DIAGNOSIS — Z79899 Other long term (current) drug therapy: Secondary | ICD-10-CM | POA: Diagnosis not present

## 2021-11-09 DIAGNOSIS — M450 Ankylosing spondylitis of multiple sites in spine: Secondary | ICD-10-CM

## 2021-11-09 DIAGNOSIS — Z87442 Personal history of urinary calculi: Secondary | ICD-10-CM

## 2021-11-09 DIAGNOSIS — E559 Vitamin D deficiency, unspecified: Secondary | ICD-10-CM

## 2021-11-09 DIAGNOSIS — M19041 Primary osteoarthritis, right hand: Secondary | ICD-10-CM | POA: Diagnosis not present

## 2021-11-09 DIAGNOSIS — M1712 Unilateral primary osteoarthritis, left knee: Secondary | ICD-10-CM

## 2021-11-09 DIAGNOSIS — K5792 Diverticulitis of intestine, part unspecified, without perforation or abscess without bleeding: Secondary | ICD-10-CM

## 2021-11-09 DIAGNOSIS — G8929 Other chronic pain: Secondary | ICD-10-CM

## 2021-11-09 DIAGNOSIS — M4854XS Collapsed vertebra, not elsewhere classified, thoracic region, sequela of fracture: Secondary | ICD-10-CM

## 2021-11-09 DIAGNOSIS — M25511 Pain in right shoulder: Secondary | ICD-10-CM | POA: Diagnosis not present

## 2021-11-09 DIAGNOSIS — M5030 Other cervical disc degeneration, unspecified cervical region: Secondary | ICD-10-CM

## 2021-11-09 DIAGNOSIS — R21 Rash and other nonspecific skin eruption: Secondary | ICD-10-CM

## 2021-11-09 DIAGNOSIS — G629 Polyneuropathy, unspecified: Secondary | ICD-10-CM

## 2021-11-09 NOTE — Progress Notes (Signed)
Office Visit Note  Patient: Benjamin Wiggins             Date of Birth: Dec 06, 1970           MRN: [id number]             PCP: Cheron Schaumann., MD Referring: Cheron Schaumann.,* Visit Date: 11/09/2021 Occupation: @GUAROCC @  Subjective:  Right shoulder pain.   History of Present Illness: Benjamin Wiggins is a 50 y.o. male with a history of ankylosing spondylitis, osteoarthritis and degenerative disc disease.  He complains of right shoulder pain for the last 1 year.  He states the pain is gradually getting worse.  He is having difficulty raising his arm.  He notices stiffness in his right hand. He denies any discomfort in his spine.  He states he has been under a lot of stress in the last few months.  He had some nonintentional weight loss.  He had work-up by gastroenterologist including endoscopy and colonoscopy.  According to the patient he was told that he had gastritis, hiatal hernia and diverticulosis.  No treatment was advised.  He also saw a dermatologist for discoloration in his skin.  No treatment was advised , and no diagnosis was given.  Activities of Daily Living:  Patient reports morning stiffness for 20-40 minutes.   Patient Reports nocturnal pain.  Difficulty dressing/grooming: Reports Difficulty climbing stairs: Reports Difficulty getting out of chair: Denies Difficulty using hands for taps, buttons, cutlery, and/or writing: Reports  Review of Systems  Constitutional:  Positive for fatigue and weight loss.       Increased stress  HENT:  Positive for mouth dryness. Negative for mouth sores and nose dryness.   Eyes:  Positive for itching and dryness. Negative for pain.  Respiratory:  Positive for shortness of breath. Negative for difficulty breathing.   Cardiovascular:  Negative for chest pain and palpitations.  Gastrointestinal:  Negative for constipation and diarrhea.  Endocrine: Negative for increased urination.  Genitourinary:  Negative for  difficulty urinating.  Musculoskeletal:  Positive for joint pain, joint pain, joint swelling and morning stiffness. Negative for myalgias, muscle tenderness and myalgias.  Skin:  Positive for rash. Negative for color change, redness and sensitivity to sunlight.  Allergic/Immunologic: Positive for susceptible to infections.  Neurological:  Positive for dizziness, numbness and headaches. Negative for memory loss and weakness.  Hematological:  Positive for bruising/bleeding tendency. Negative for swollen glands.  Psychiatric/Behavioral:  Positive for sleep disturbance. Negative for depressed mood and confusion. The patient is not nervous/anxious.    PMFS History:  Patient Active Problem List   Diagnosis Date Noted   Metatarsal fracture 06/14/2021   DDD (degenerative disc disease), cervical 10/01/2018   Ankylosing spondylitis (HCC) 11/07/2016   High risk medication use 11/07/2016   Neuropathy 02/23/2016   Numbness 02/23/2016   Facial numbness 02/23/2016   Gait disorder 02/23/2016   Memory changes 02/23/2016    Past Medical History:  Diagnosis Date   Ankylosing spondylitis (HCC)    Kidney stones    per patient    Neuropathy     Family History  Problem Relation Age of Onset   Diabetes Father    Heart disease Father    Neuropathy Neg Hx    Past Surgical History:  Procedure Laterality Date   ANKLE SURGERY     titatium plate   Social History   Social History Narrative   Lives with 02/25/2016    Caffeine use: 3-4 cups per  day   Immunization History  Administered Date(s) Administered   Influenza-Unspecified 08/22/2017, 09/17/2018   Pneumococcal Conjugate-13 09/17/2018   Pneumococcal Polysaccharide-23 08/22/2017     Objective: Vital Signs: BP 107/76 (BP Location: Left Arm, Patient Position: Sitting, Cuff Size: Normal)   Pulse 80   Ht 6' (1.829 m)   Wt 178 lb (80.7 kg)   BMI 24.14 kg/m    Physical Exam Vitals and nursing note reviewed.  Constitutional:       Appearance: He is well-developed.  HENT:     Head: Normocephalic and atraumatic.  Eyes:     Conjunctiva/sclera: Conjunctivae normal.     Pupils: Pupils are equal, round, and reactive to light.  Cardiovascular:     Rate and Rhythm: Normal rate and regular rhythm.     Heart sounds: Normal heart sounds.  Pulmonary:     Effort: Pulmonary effort is normal.     Breath sounds: Normal breath sounds.  Abdominal:     General: Bowel sounds are normal.     Palpations: Abdomen is soft.  Musculoskeletal:     Cervical back: Normal range of motion and neck supple.  Skin:    General: Skin is warm and dry.     Capillary Refill: Capillary refill takes less than 2 seconds.     Comments: Dry scales are noted over bilateral forearm.  Neurological:     Mental Status: He is alert and oriented to person, place, and time.  Psychiatric:        Behavior: Behavior normal.     Musculoskeletal Exam: C-spine was in good range of motion with some stiffness.  He had limited range of motion of thoracic and lumbar spine.  There was no tenderness over SI joints.  Schober's was limited.  He had discomfort with abduction of his right shoulder joint and tenderness over acromioclavicular region.  Elbow joints and wrist joints with good range of motion.  He had bilateral PIP and DIP thickening with no synovitis.  Hip joints and knee joints in good range of motion.  There was no tenderness over ankles or MTPs.  CDAI Exam: CDAI Score: -- Patient Global: --; Provider Global: -- Swollen: --; Tender: -- Joint Exam 11/09/2021   No joint exam has been documented for this visit   There is currently no information documented on the homunculus. Go to the Rheumatology activity and complete the homunculus joint exam.  Investigation: No additional findings.  Imaging: XR Shoulder Right  Result Date: 11/09/2021 No glenohumeral joint space narrowing was noted.  Extra-articular calcification consistent with calcific tendinosis  was noted.  No significant acromioclavicular narrowing was noted. 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   Recent Labs: Lab Results  Component Value Date   WBC 6.9 11/03/2020   HGB 15.7 11/03/2020   PLT 265 11/03/2020   NA 138 11/03/2020   K 4.9 11/03/2020   CL 101 11/03/2020   CO2 31 11/03/2020   GLUCOSE 99 11/03/2020   BUN 14 11/03/2020   CREATININE 0.89 11/03/2020   BILITOT 0.6 11/03/2020   ALKPHOS 96 07/18/2017   AST 18 11/03/2020   ALT 26 11/03/2020   PROT 7.2 11/03/2020   ALBUMIN 4.3 07/18/2017   CALCIUM 9.8 11/03/2020   GFRAA 116 11/03/2020   QFTBGOLDPLUS NEGATIVE 11/03/2020   08/10/2021 CBC normal, CMP normal May 19, 2021 vitamin D 26  Speciality Comments: No specialty comments available.  Procedures:  No procedures performed Allergies: Iohexol and Iodinated diagnostic agents  Assessment / Plan:     Visit Diagnoses: Ankylosing spondylitis of multiple sites in spine (HCC)-he states the spine pain and discomfort is a stable.  He has limited mobility in his thoracic and lumbar spine.  He denies any increased discomfort.  He had no point tenderness.  There was no tenderness over SI joints.  High risk medication use - Enbrel SureClick 50 mg sq injections every 7 days - Plan: CBC with Differential/Platelet, COMPLETE METABOLIC PANEL WITH GFR, QuantiFERON-TB Gold Plus today and then every 3 months.  I advised him to hold Enbrel in case he develops an infection and resume after the infection resolves.  Information regarding the AVS was also placed in the AVS.  Annual skin examination with the dermatologist to screen for nonmelanoma skin cancer was also advised.  Use of sunscreen and sun protection was advised.  Chronic right shoulder pain -he has been having pain and discomfort in his right shoulder joint for 1 year.  He states the pain is gradually getting worse.  He has difficulty abducting his right arm.  He had tenderness over  right acromioclavicular joint.  Plan: XR Shoulder Right.  No glenohumeral or acromioclavicular joint space narrowing was noted.  Extra-articular calcification consistent with calcific tendinosis was noted.  A handout on exercises was placed in the AVS.  If he has persistent symptoms he may benefit from cortisone injection.  Primary osteoarthritis of both hands - X-rays of both hands were obtained on 10/17/2017 which were consistent with mild osteoarthritic changes.  No erosive changes.  He continues to have some stiffness in his hands.  DIP and PIP thickening was noted.  Joint protection and muscle strengthening was advised.  Primary osteoarthritis of left knee - X-rays of the left knee were obtained on 10/17/17 which were consistent with moderate osteoarthritis moderate chondromalacia patella.  He denies any discomfort today.  DDD (degenerative disc disease), cervical-he had some stiffness with range of motion of his cervical spine.  Nontraumatic compression fracture of T12 vertebra, sequela  Pain of both eyes-he complains of pain in his both eyes.  No conjunctival injection was noted.  I will refer him to ophthalmology.  Rash-he has dry scales on his bilateral extremity.  I will refer him to dermatology for evaluation.  He was advised to have annual skin examination by dermatology to screen for skin cancer while he is on Enbrel.  Diverticulitis - He was diagnosed with diverticulitis on 12/25/18.  Patient states he had recent GI work-up for weight loss.  He had endoscopy and colonoscopy.  He did not have a follow-up appointment with the gastroenterologist.  Follow-up appointment was advised.  Neuropathy  Vitamin D deficiency -he was given vitamin D for vitamin D deficiency.  We will check vitamin D level today.  Plan: VITAMIN D 25 Hydroxy (Vit-D Deficiency, Fractures)  History of kidney stones  Orders: Orders Placed This Encounter  Procedures   XR Shoulder Right   CBC with  Differential/Platelet   COMPLETE METABOLIC PANEL WITH GFR   VITAMIN D 25 Hydroxy (Vit-D Deficiency, Fractures)   QuantiFERON-TB Gold Plus   Ambulatory referral to Dermatology   Ambulatory referral to Ophthalmology    No orders of the defined types were placed in this encounter.   Follow-Up Instructions: Return in about 3 months (around 02/07/2022) for Ankylosing spondylitis, Osteoarthritis.   Bo Merino, MD  Note - This record has been created using Editor, commissioning.  Chart creation errors have been sought, but may not always  have been  located. Such creation errors do not reflect on  the standard of medical care.

## 2021-11-09 NOTE — Patient Instructions (Addendum)
Please get annual skin examination by dermatologist to screen for skin cancer while you are on Enbrel.  Use sunscreen and sun protection on a regular basis.  Standing Labs We placed an order today for your standing lab work.   Please have your standing labs drawn in March and every 3 months  If possible, please have your labs drawn 2 weeks prior to your appointment so that the provider can discuss your results at your appointment.  Please note that you may see your imaging and lab results in MyChart before we have reviewed them. We may be awaiting multiple results to interpret others before contacting you. Please allow our office up to 72 hours to thoroughly review all of the results before contacting the office for clarification of your results.  We have open lab daily: Monday through Thursday from 1:30-4:30 PM and Friday from 1:30-4:00 PM at the office of Dr. Pollyann Savoy, [facility].   Please be advised, all patients with office appointments requiring lab work will take precedent over walk-in lab work.  If possible, please come for your lab work on Monday and Friday afternoons, as you may experience shorter wait times. The office is located at [address], [place], [place] [address] No appointment is necessary.   Labs are drawn by [facility]. Please bring your co-pay at the time of your lab draw.  You may receive a bill from [facility] for your lab work.  If you wish to have your labs drawn at another location, please call the office 24 hours in advance to send orders.  If you have any questions regarding directions or hours of operation,  please call [phone].   As a reminder, please drink plenty of water prior to coming for your lab work. Thanks!   Vaccines You are taking a medication(s) that can suppress your immune system.  The following immunizations are recommended: Flu annually Covid-19  Td/Tdap (tetanus, diphtheria, pertussis) every 10  years Pneumonia (Prevnar 15 then Pneumovax 23 at least 1 year apart.  Alternatively, can take Prevnar 20 without needing additional dose) Shingrix: 2 doses from 4 weeks to 6 months apart  Please check with your PCP to make sure you are up to date.   If you have signs or symptoms of an infection or start antibiotics: First, call your PCP for workup of your infection. Hold your medication through the infection, until you complete your antibiotics, and until symptoms resolve if you take the following: Injectable medication (Actemra, Benlysta, Cimzia, Cosentyx, Enbrel, Humira, Kevzara, Orencia, Remicade, Simponi, Stelara, Taltz, Tremfya) Methotrexate Leflunomide (Arava) Mycophenolate (Cellcept) Harriette Ohara, Olumiant, or Rinvoq  Heart Disease Prevention   Your inflammatory disease increases your risk of heart disease which includes heart attack, stroke, atrial fibrillation (irregular heartbeats), high blood pressure, heart failure and atherosclerosis (plaque in the arteries).  It is important to reduce your risk by:   Keep blood pressure, cholesterol, and blood sugar at healthy levels   Smoking Cessation   Maintain a healthy weight  BMI 20-25   Eat a healthy diet  Plenty of fresh fruit, vegetables, and whole grains  Limit saturated fats, foods high in sodium, and added sugars  DASH and Mediterranean diet   Increase physical activity  Recommend moderate physically activity for 150 minutes per week/ 30 minutes a day for five days a week These can be broken up into three separate ten-minute sessions during the day.   Reduce Stress  Meditation, slow breathing exercises, yoga, coloring books  Dental visits twice a year   Shoulder Exercises Ask your health care provider which exercises are safe for you. Do exercises exactly as told by your health care provider and adjust them as directed. It is normal to feel mild stretching, pulling, tightness, or discomfort as you do these exercises. Stop  right away if you feel sudden pain or your pain gets worse. Do not begin these exercises until told by your health care provider. Stretching exercises External rotation and abduction This exercise is sometimes called corner stretch. This exercise rotates your arm outward (external rotation) and moves your arm out from your body (abduction). Stand in a doorway with one of your feet slightly in front of the other. This is called a staggered stance. If you cannot reach your forearms to the door frame, stand facing a corner of a room. Choose one of the following positions as told by your health care provider: Place your hands and forearms on the door frame above your head. Place your hands and forearms on the door frame at the height of your head. Place your hands on the door frame at the height of your elbows. Slowly move your weight onto your front foot until you feel a stretch across your chest and in the front of your shoulders. Keep your head and chest upright and keep your abdominal muscles tight. Hold for __________ seconds. To release the stretch, shift your weight to your back foot. Repeat __________ times. Complete this exercise __________ times a day. Extension, standing Stand and hold a broomstick, a cane, or a similar object behind your back. Your hands should be a little wider than shoulder width apart. Your palms should face away from your back. Keeping your elbows straight and your shoulder muscles relaxed, move the stick away from your body until you feel a stretch in your shoulders (extension). Avoid shrugging your shoulders while you move the stick. Keep your shoulder blades tucked down toward the middle of your back. Hold for __________ seconds. Slowly return to the starting position. Repeat __________ times. Complete this exercise __________ times a day. Range-of-motion exercises Pendulum  Stand near a wall or a surface that you can hold onto for balance. Bend at the waist  and let your left / right arm hang straight down. Use your other arm to support you. Keep your back straight and do not lock your knees. Relax your left / right arm and shoulder muscles, and move your hips and your trunk so your left / right arm swings freely. Your arm should swing because of the motion of your body, not because you are using your arm or shoulder muscles. Keep moving your hips and trunk so your arm swings in the following directions, as told by your health care provider: Side to side. Forward and backward. In clockwise and counterclockwise circles. Continue each motion for __________ seconds, or for as long as told by your health care provider. Slowly return to the starting position. Repeat __________ times. Complete this exercise __________ times a day. Shoulder flexion, standing  Stand and hold a broomstick, a cane, or a similar object. Place your hands a little more than shoulder width apart on the object. Your left / right hand should be palm up, and your other hand should be palm down. Keep your elbow straight and your shoulder muscles relaxed. Push the stick up with your healthy arm to raise your left / right arm in front of your body, and then over your head until you  feel a stretch in your shoulder (flexion). Avoid shrugging your shoulder while you raise your arm. Keep your shoulder blade tucked down toward the middle of your back. Hold for __________ seconds. Slowly return to the starting position. Repeat __________ times. Complete this exercise __________ times a day. Shoulder abduction, standing Stand and hold a broomstick, a cane, or a similar object. Place your hands a little more than shoulder width apart on the object. Your left / right hand should be palm up, and your other hand should be palm down. Keep your elbow straight and your shoulder muscles relaxed. Push the object across your body toward your left / right side. Raise your left / right arm to the side of  your body (abduction) until you feel a stretch in your shoulder. Do not raise your arm above shoulder height unless your health care provider tells you to do that. If directed, raise your arm over your head. Avoid shrugging your shoulder while you raise your arm. Keep your shoulder blade tucked down toward the middle of your back. Hold for __________ seconds. Slowly return to the starting position. Repeat __________ times. Complete this exercise __________ times a day. Internal rotation  Place your left / right hand behind your back, palm up. Use your other hand to dangle an exercise band, a towel, or a similar object over your shoulder. Grasp the band with your left / right hand so you are holding on to both ends. Gently pull up on the band until you feel a stretch in the front of your left / right shoulder. The movement of your arm toward the center of your body is called internal rotation. Avoid shrugging your shoulder while you raise your arm. Keep your shoulder blade tucked down toward the middle of your back. Hold for __________ seconds. Release the stretch by letting go of the band and lowering your hands. Repeat __________ times. Complete this exercise __________ times a day. Strengthening exercises External rotation  Sit in a stable chair without armrests. Secure an exercise band to a stable object at elbow height on your left / right side. Place a soft object, such as a folded towel or a small pillow, between your left / right upper arm and your body to move your elbow about 4 inches (10 cm) away from your side. Hold the end of the exercise band so it is tight and there is no slack. Keeping your elbow pressed against the soft object, slowly move your forearm out, away from your abdomen (external rotation). Keep your body steady so only your forearm moves. Hold for __________ seconds. Slowly return to the starting position. Repeat __________ times. Complete this exercise __________  times a day. Shoulder abduction  Sit in a stable chair without armrests, or stand up. Hold a __________ weight in your left / right hand, or hold an exercise band with both hands. Start with your arms straight down and your left / right palm facing in, toward your body. Slowly lift your left / right hand out to your side (abduction). Do not lift your hand above shoulder height unless your health care provider tells you that this is safe. Keep your arms straight. Avoid shrugging your shoulder while you do this movement. Keep your shoulder blade tucked down toward the middle of your back. Hold for __________ seconds. Slowly lower your arm, and return to the starting position. Repeat __________ times. Complete this exercise __________ times a day. Shoulder extension Sit in a stable chair without  armrests, or stand up. Secure an exercise band to a stable object in front of you so it is at shoulder height. Hold one end of the exercise band in each hand. Your palms should face each other. Straighten your elbows and lift your hands up to shoulder height. Step back, away from the secured end of the exercise band, until the band is tight and there is no slack. Squeeze your shoulder blades together as you pull your hands down to the sides of your thighs (extension). Stop when your hands are straight down by your sides. Do not let your hands go behind your body. Hold for __________ seconds. Slowly return to the starting position. Repeat __________ times. Complete this exercise __________ times a day. Shoulder row Sit in a stable chair without armrests, or stand up. Secure an exercise band to a stable object in front of you so it is at waist height. Hold one end of the exercise band in each hand. Position your palms so that your thumbs are facing the ceiling (neutral position). Bend each of your elbows to a 90-degree angle (right angle) and keep your upper arms at your sides. Step back until the band  is tight and there is no slack. Slowly pull your elbows back behind you. Hold for __________ seconds. Slowly return to the starting position. Repeat __________ times. Complete this exercise __________ times a day. Shoulder press-ups  Sit in a stable chair that has armrests. Sit upright, with your feet flat on the floor. Put your hands on the armrests so your elbows are bent and your fingers are pointing forward. Your hands should be about even with the sides of your body. Push down on the armrests and use your arms to lift yourself off the chair. Straighten your elbows and lift yourself up as much as you comfortably can. Move your shoulder blades down, and avoid letting your shoulders move up toward your ears. Keep your feet on the ground. As you get stronger, your feet should support less of your body weight as you lift yourself up. Hold for __________ seconds. Slowly lower yourself back into the chair. Repeat __________ times. Complete this exercise __________ times a day. Wall push-ups  Stand so you are facing a stable wall. Your feet should be about one arm-length away from the wall. Lean forward and place your palms on the wall at shoulder height. Keep your feet flat on the floor as you bend your elbows and lean forward toward the wall. Hold for __________ seconds. Straighten your elbows to push yourself back to the starting position. Repeat __________ times. Complete this exercise __________ times a day. This information is not intended to replace advice given to you by your health care provider. Make sure you discuss any questions you have with your health care provider. Document Revised: 03/14/2019 Document Reviewed: 12/20/2018 Elsevier Patient Education  2022 [employer].

## 2021-11-09 NOTE — Telephone Encounter (Signed)
X-ray revealed Calcification of the tendon in the shoulder joint.  Patient was provided with exercises and the next steps are physical therapy and injection.   Patient advised of results and recommendations. Patient states he will do exercises at home and call if it doesn't improve. At that time we can place referral for physical therapy.

## 2021-11-09 NOTE — Telephone Encounter (Signed)
Attempted to contact patient and voicemail box is not set up yet.   Calling to provide patient with x-ray results per Dr. Corliss Skains.   X-ray revealed Calcification of the tendon in the shoulder joint.  Patient was provided with exercises and the next steps are physical therapy and injection.

## 2021-11-11 LAB — COMPLETE METABOLIC PANEL WITH GFR
AG Ratio: 1.5 (calc) (ref 1.0–2.5)
ALT: 23 U/L (ref 9–46)
AST: 14 U/L (ref 10–35)
Albumin: 4.3 g/dL (ref 3.6–5.1)
Alkaline phosphatase (APISO): 71 U/L (ref 35–144)
BUN: 13 mg/dL (ref 7–25)
CO2: 25 mmol/L (ref 20–32)
Calcium: 9.7 mg/dL (ref 8.6–10.3)
Chloride: 104 mmol/L (ref 98–110)
Creat: 0.86 mg/dL (ref 0.70–1.30)
Globulin: 2.8 g/dL (calc) (ref 1.9–3.7)
Glucose, Bld: 94 mg/dL (ref 65–99)
Potassium: 5 mmol/L (ref 3.5–5.3)
Sodium: 140 mmol/L (ref 135–146)
Total Bilirubin: 0.7 mg/dL (ref 0.2–1.2)
Total Protein: 7.1 g/dL (ref 6.1–8.1)
eGFR: 105 mL/min/{1.73_m2} (ref >=60)

## 2021-11-11 LAB — QUANTIFERON-TB GOLD PLUS
Mitogen-NIL: >10 IU/mL
NIL: 0.03 IU/mL
QuantiFERON-TB Gold Plus: NEGATIVE
TB1-NIL: 0.01 IU/mL
TB2-NIL: 0.03 IU/mL

## 2021-11-11 LAB — CBC WITH DIFFERENTIAL/PLATELET
Absolute Monocytes: 552 cells/uL (ref 200–950)
Basophils Absolute: 37 cells/uL (ref 0–200)
Basophils Relative: 0.6 %
Eosinophils Absolute: 149 cells/uL (ref 15–500)
Eosinophils Relative: 2.4 %
HCT: 48.1 % (ref 38.5–50.0)
Hemoglobin: 16.4 g/dL (ref 13.2–17.1)
Lymphs Abs: 1612 cells/uL (ref 850–3900)
MCH: 33.3 pg — ABNORMAL HIGH (ref 27.0–33.0)
MCHC: 34.1 g/dL (ref 32.0–36.0)
MCV: 97.8 fL (ref 80.0–100.0)
MPV: 9.4 fL (ref 7.5–12.5)
Monocytes Relative: 8.9 %
Neutro Abs: 3850 cells/uL (ref 1500–7800)
Neutrophils Relative %: 62.1 %
Platelets: 252 10*3/uL (ref 140–400)
RBC: 4.92 10*6/uL (ref 4.20–5.80)
RDW: 12 % (ref 11.0–15.0)
Total Lymphocyte: 26 %
WBC: 6.2 10*3/uL (ref 3.8–10.8)

## 2021-11-11 LAB — VITAMIN D 25 HYDROXY (VIT D DEFICIENCY, FRACTURES): Vit D, 25-Hydroxy: 63 ng/mL (ref 30–100)

## 2021-11-13 NOTE — Progress Notes (Signed)
TB Gold is negative.

## 2021-11-14 ENCOUNTER — Telehealth: Payer: Self-pay | Admitting: Physician Assistant

## 2021-11-14 ENCOUNTER — Other Ambulatory Visit: Payer: Self-pay | Admitting: Rheumatology

## 2021-11-14 ENCOUNTER — Telehealth: Payer: Self-pay

## 2021-11-14 DIAGNOSIS — E559 Vitamin D deficiency, unspecified: Secondary | ICD-10-CM

## 2021-11-14 NOTE — Telephone Encounter (Signed)
Too soon to refill tizanidine and Vitamin D was normal with most recent labs.

## 2021-11-14 NOTE — Telephone Encounter (Signed)
See lab note for details.  

## 2021-11-14 NOTE — Telephone Encounter (Signed)
Patient left a voicemail stating he was returning a call regarding his labwork results.

## 2021-11-14 NOTE — Telephone Encounter (Signed)
Error

## 2021-11-18 ENCOUNTER — Other Ambulatory Visit: Payer: Self-pay | Admitting: Rheumatology

## 2021-11-18 MED ORDER — TIZANIDINE HCL 4 MG PO TABS: 4.0000 mg | ORAL_TABLET | Freq: Every evening | ORAL | 2 refills | Status: DC | PRN

## 2021-11-18 NOTE — Telephone Encounter (Signed)
[facility] called the office regarding a prescription refill for tizanidine 4mg  for . Pharmacy states they never received a prescription refill on 11/02/21. Pharmacy states that the last refill they received was on 10/02/21. Pharmacy is requesting a refill of this medication. [facility] on [address] in [place].

## 2021-11-18 NOTE — Telephone Encounter (Signed)
Spoke with the pharmacy staff and they verified they did not receive the prescription we sent in on November 02, 2021 for the Tizanidine.  Next Visit: 02/06/2022  Last Visit: 11/09/2021  Dx: Ankylosing spondylitis of multiple sites in spine   Current Dose per office note on 11/09/2021: not discussed   Okay to refill Tizanidine?

## 2022-01-11 ENCOUNTER — Other Ambulatory Visit: Payer: Self-pay | Admitting: Physician Assistant

## 2022-01-11 ENCOUNTER — Other Ambulatory Visit (HOSPITAL_COMMUNITY): Payer: Self-pay

## 2022-01-11 MED ORDER — ENBREL SURECLICK 50 MG/ML ~~LOC~~ SOAJ
SUBCUTANEOUS | 0 refills | Status: DC
  Filled 2022-01-13: qty 12, 84d supply, fill #0

## 2022-01-11 NOTE — Telephone Encounter (Signed)
Next Visit: 02/06/2022   Last Visit: 11/09/2021   Dx: Ankylosing spondylitis of multiple sites in spine    Current Dose per office note on 11/09/2021: Enbrel SureClick 50 mg sq injections every 7 days   Labs: 11/09/2021 CBC and CMP WNL.  TB Gold: 11/09/2021 Neg   Last Fill: 10/14/2021  Okay to refill Enbrel?

## 2022-01-13 ENCOUNTER — Other Ambulatory Visit (HOSPITAL_COMMUNITY): Payer: Self-pay

## 2022-01-19 ENCOUNTER — Other Ambulatory Visit (HOSPITAL_COMMUNITY): Payer: Self-pay

## 2022-01-23 NOTE — Progress Notes (Deleted)
? ?Office Visit Note ? ?Patient: Benjamin Wiggins             ?Date of Birth: [birth year]           ?MRN: [id number]             ?PCP: Loraine Leriche., MD ?Referring: Loraine Leriche.,* ?Visit Date: 02/06/2022 ?Occupation: @GUAROCC @ ? ?Subjective:  ?No chief complaint on file. ? ? ?History of Present Illness: Benjamin Wiggins is a 51 y.o. male ***  ? ?Activities of Daily Living:  ?Patient reports morning stiffness for *** {minute/hour:19697}.   ?Patient {ACTIONS;DENIES/REPORTS:21021675::"Denies"} nocturnal pain.  ?Difficulty dressing/grooming: {ACTIONS;DENIES/REPORTS:21021675::"Denies"} ?Difficulty climbing stairs: {ACTIONS;DENIES/REPORTS:21021675::"Denies"} ?Difficulty getting out of chair: {ACTIONS;DENIES/REPORTS:21021675::"Denies"} ?Difficulty using hands for taps, buttons, cutlery, and/or writing: {ACTIONS;DENIES/REPORTS:21021675::"Denies"} ? ?No Rheumatology ROS completed.  ? ?PMFS History:  ?Patient Active Problem List  ? Diagnosis Date Noted  ? Metatarsal fracture 06/14/2021  ? DDD (degenerative disc disease), cervical 10/01/2018  ? Ankylosing spondylitis ([place]) 11/07/2016  ? High risk medication use 11/07/2016  ? Neuropathy 02/23/2016  ? Numbness 02/23/2016  ? Facial numbness 02/23/2016  ? Gait disorder 02/23/2016  ? Memory changes 02/23/2016  ?  ?Past Medical History:  ?Diagnosis Date  ? Ankylosing spondylitis ([place])   ? Kidney stones   ? per patient   ? Neuropathy   ?  ?Family History  ?Problem Relation Age of Onset  ? Diabetes Father   ? Heart disease Father   ? Neuropathy Neg Hx   ? ?Past Surgical History:  ?Procedure Laterality Date  ? ANKLE SURGERY    ? titatium plate  ? ?Social History  ? ?Social History Narrative  ? Lives with [name]   ? Caffeine use: 3-4 cups per day  ? ?Immunization History  ?Administered Date(s) Administered  ? Influenza-Unspecified 08/22/2017, 09/17/2018  ? Pneumococcal Conjugate-13 09/17/2018  ? Pneumococcal Polysaccharide-23 08/22/2017  ?   ? ?Objective: ?Vital Signs: There were no vitals taken for this visit.  ? ?Physical Exam  ? ?Musculoskeletal Exam: *** ? ?CDAI Exam: ?CDAI Score: -- ?Patient Global: --; Provider Global: -- ?Swollen: --; Tender: -- ?Joint Exam 02/06/2022  ? ?No joint exam has been documented for this visit  ? ?There is currently no information documented on the homunculus. Go to the Rheumatology activity and complete the homunculus joint exam. ? ?Investigation: ?No additional findings. ? ?Imaging: ?No results found. ? ?Recent Labs: ?Lab Results  ?Component Value Date  ? WBC 6.2 11/09/2021  ? HGB 16.4 11/09/2021  ? PLT 252 11/09/2021  ? NA 140 11/09/2021  ? K 5.0 11/09/2021  ? CL 104 11/09/2021  ? CO2 25 11/09/2021  ? GLUCOSE 94 11/09/2021  ? BUN 13 11/09/2021  ? CREATININE 0.86 11/09/2021  ? BILITOT 0.7 11/09/2021  ? ALKPHOS 96 07/18/2017  ? AST 14 11/09/2021  ? ALT 23 11/09/2021  ? PROT 7.1 11/09/2021  ? ALBUMIN 4.3 07/18/2017  ? CALCIUM 9.7 11/09/2021  ? GFRAA 116 11/03/2020  ? QFTBGOLDPLUS NEGATIVE 11/09/2021  ? ? ?Speciality Comments: No specialty comments available. ? ?Procedures:  ?No procedures performed ?Allergies: Iohexol and Iodinated contrast media  ? ?Assessment / Plan:     ?Visit Diagnoses: No diagnosis found. ? ?Orders: ?No orders of the defined types were placed in this encounter. ? ?No orders of the defined types were placed in this encounter. ? ? ?Face-to-face time spent with patient was *** minutes. Greater than 50% of time was spent in counseling and coordination of care. ? ?Follow-Up Instructions:  No follow-ups on file. ? ? ?Earnestine Mealing, CMA ? ?Note - This record has been created using [employer].  ?Chart creation errors have been sought, but may not always  ?have been located. Such creation errors do not reflect on  ?the standard of medical care.  ?

## 2022-02-03 NOTE — Progress Notes (Signed)
Office Visit Note  Patient: Benjamin Wiggins             Date of Birth: Sep 18, 1971           MRN: [id number]             PCP: Cheron Schaumann., MD Referring: Cheron Schaumann.,* Visit Date: 02/06/2022 Occupation: @GUAROCC @  Subjective:  Fatigue   History of Present Illness: Benjamin Wiggins is a 51 y.o. male with history of ankylosing spondylitis.  He is on Enbrel SureClick 50 mg sq injections every 7 days.  He has not missed any doses of Enbrel recently.  Overall his ankylosing spondylitis has been stable.  He has been under less stress recently which she feels has been improving his symptoms.  He has chronic pain and stiffness in his neck and lower back.  He denies any SI joint discomfort currently.  He experiences intermittent pain and stiffness in both hands but denies any joint swelling.  He experiences intermittent fatigue which can last 36 to 48 hours at a time.  He tries to remain active and would like to increase his muscle strengthening regimen as tolerated.  He has not had any recent infections.   Activities of Daily Living:  Patient reports morning stiffness for 30 minutes.   Patient Reports nocturnal pain.  Difficulty dressing/grooming: Reports Difficulty climbing stairs: Reports Difficulty getting out of chair: Reports Difficulty using hands for taps, buttons, cutlery, and/or writing: Reports  Review of Systems  Constitutional:  Positive for fatigue.  HENT:  Positive for mouth dryness and nose dryness. Negative for mouth sores.   Eyes:  Positive for pain, itching and dryness.  Respiratory:  Negative for shortness of breath and difficulty breathing.   Cardiovascular:  Negative for chest pain and palpitations.  Gastrointestinal:  Negative for blood in stool, constipation and diarrhea.  Endocrine: Negative for increased urination.  Genitourinary:  Negative for difficulty urinating.  Musculoskeletal:  Positive for joint pain, joint pain, joint  swelling and morning stiffness. Negative for myalgias, muscle tenderness and myalgias.  Skin:  Positive for color change. Negative for rash and redness.  Allergic/Immunologic: Positive for susceptible to infections.  Neurological:  Positive for numbness, headaches and weakness. Negative for dizziness and memory loss.  Hematological:  Positive for bruising/bleeding tendency.  Psychiatric/Behavioral:  Negative for confusion.    PMFS History:  Patient Active Problem List   Diagnosis Date Noted   Metatarsal fracture 06/14/2021   DDD (degenerative disc disease), cervical 10/01/2018   Ankylosing spondylitis ([facility]) 11/07/2016   High risk medication use 11/07/2016   Neuropathy 02/23/2016   Numbness 02/23/2016   Facial numbness 02/23/2016   Gait disorder 02/23/2016   Memory changes 02/23/2016    Past Medical History:  Diagnosis Date   Ankylosing spondylitis ([facility])    Kidney stones    per patient    Neuropathy     Family History  Problem Relation Age of Onset   Diabetes Father    Heart disease Father    Neuropathy Neg Hx    Past Surgical History:  Procedure Laterality Date   ANKLE SURGERY     titatium plate   Social History   Social History Narrative   Lives with 02/25/2016    Caffeine use: 3-4 cups per day   Immunization History  Administered Date(s) Administered   Influenza-Unspecified 08/22/2017, 09/17/2018   Pneumococcal Conjugate-13 09/17/2018   Pneumococcal Polysaccharide-23 08/22/2017     Objective: Vital Signs: BP 101/69 (BP  Location: Left Arm, Patient Position: Sitting, Cuff Size: Normal)    Pulse 86    Ht 6' (1.829 m)    Wt 178 lb (80.7 kg)    BMI 24.14 kg/m    Physical Exam Vitals and nursing note reviewed.  Constitutional:      Appearance: He is well-developed.  HENT:     Head: Normocephalic and atraumatic.  Eyes:     Conjunctiva/sclera: Conjunctivae normal.     Pupils: Pupils are equal, round, and reactive to light.  Cardiovascular:     Rate and  Rhythm: Normal rate and regular rhythm.     Heart sounds: Normal heart sounds.  Pulmonary:     Effort: Pulmonary effort is normal.     Breath sounds: Normal breath sounds.  Abdominal:     General: Bowel sounds are normal.     Palpations: Abdomen is soft.  Musculoskeletal:     Cervical back: Normal range of motion and neck supple.  Skin:    General: Skin is warm and dry.     Capillary Refill: Capillary refill takes less than 2 seconds.  Neurological:     Mental Status: He is alert and oriented to person, place, and time.  Psychiatric:        Behavior: Behavior normal.     Musculoskeletal Exam: Slightly limited range of motion of the C-spine, thoracic spine, and lumbar spine.  No midline spinal tenderness or SI joint tenderness.  Shoulder joints, elbow joints, wrist joints, MCPs, PIPs, and DIPs good ROM with no synovitis.  PIP and DIP thickening consistent with OA.  Hip joints have slightly limited ROM.  Knee joints and ankle joints have good ROM with no discomfort.  No warmth or effusion of knee joints. No tenderness or swelling of ankle joints.   CDAI Exam: CDAI Score: -- Patient Global: --; Provider Global: -- Swollen: --; Tender: -- Joint Exam 02/06/2022   No joint exam has been documented for this visit   There is currently no information documented on the homunculus. Go to the Rheumatology activity and complete the homunculus joint exam.  Investigation: No additional findings.  Imaging: No results found.  Recent Labs: Lab Results  Component Value Date   WBC 6.2 11/09/2021   HGB 16.4 11/09/2021   PLT 252 11/09/2021   NA 140 11/09/2021   K 5.0 11/09/2021   CL 104 11/09/2021   CO2 25 11/09/2021   GLUCOSE 94 11/09/2021   BUN 13 11/09/2021   CREATININE 0.86 11/09/2021   BILITOT 0.7 11/09/2021   ALKPHOS 96 07/18/2017   AST 14 11/09/2021   ALT 23 11/09/2021   PROT 7.1 11/09/2021   ALBUMIN 4.3 07/18/2017   CALCIUM 9.7 11/09/2021   GFRAA 116 11/03/2020    QFTBGOLDPLUS NEGATIVE 11/09/2021    Speciality Comments: No specialty comments available.  Procedures:  No procedures performed Allergies: Iohexol and Iodinated contrast media    Assessment / Plan:     Visit Diagnoses: Ankylosing spondylitis of multiple sites in spine [facility]): He has no synovitis or dactylitis on examination today.  He has slightly limited range of motion of the C-spine, thoracic spine, lumbar spine on examination today.  No midline spinal tenderness or SI joint tenderness was noted.  No evidence of Achilles tendinitis or planter fasciitis.  He continues to experience intermittent pain in both hands, C-spine, and his lower back.  He also has episodic fatigue lasting 36 to 48 hours at a time.  Overall his ankylosing spondylitis is well controlled on  the current regimen of Enbrel 50 mg subcu injections once weekly.  No medication changes will be made at this time.  Discussed the importance of joint protection and muscle strengthening.  He was encouraged to try using a foam roller.  Discussed the importance of core strengthening.  He will follow-up in the office in 5 months.  High risk medication use - Enbrel SureClick 50 mg sq injections every 7 days - Plan: COMPLETE METABOLIC PANEL WITH GFR, CBC with Differential/Platelet TB gold negative on 11/09/21.  CBC and CMP updated on 11/09/21.  Orders for CBC and CMP released today.  His next lab work will be due in June and every 3 months to monitor for drug toxicity. He has not had any recent infections.  Discussed the importance of holding Enbrel if he develops signs or symptoms of infection and to resume once the infection is completely cleared.   Discussed the importance of yearly skin examinations while on Enbrel.  He has an upcoming appointment with dermatology for further evaluation and management.  Pain of both eyes: He experiences episodic pain in both eyes which can last several days at a time.  He has also noticed some sensitivity to  light.  Referral to [facility] eye care was placed in December 2022 but the patient has not scheduled office visit.  He requested a referral to Dr. Sherryll Burger for further evaluation and management.  A referral will be placed today.  Chronic right shoulder pain: Improved.  Has good range of motion of the right shoulder joint with no discomfort on examination today.  Primary osteoarthritis of both hands: He has PIP and DIP thickening consistent with osteoarthritis of both hands.  He was able to make a complete fist bilaterally.  Discussed the importance of joint protection and muscle strengthening.  He was given a handout of exercises to perform.  Primary osteoarthritis of left knee: He has good range of motion of the left knee joint on examination today.  No warmth or effusion was noted.  Discussed the importance of lower extremity muscle strengthening.  Trochanteric bursitis of left hip: Improved.  Discussed the importance of performing stretching exercises daily.  He was encouraged to try using a foam roller.  DDD (degenerative disc disease), cervical: He has slightly limited range of motion with lateral rotation.  He was given a handout of neck exercises to perform.  Other medical conditions are listed as follows:   Nontraumatic compression fracture of T12 vertebra, sequela  Diverticulitis  Neuropathy  Vitamin D deficiency  History of kidney stones  History of foot fracture  Orders: Orders Placed This Encounter  Procedures   COMPLETE METABOLIC PANEL WITH GFR   CBC with Differential/Platelet   Meds ordered this encounter  Medications   tiZANidine (ZANAFLEX) 4 MG tablet    Sig: Take 1 tablet (4 mg total) by mouth at bedtime as needed.    Dispense:  30 tablet    Refill:  2     Follow-Up Instructions: Return in about 5 months (around 07/09/2022) for Ankylosing Spondylitis.   Gearldine Bienenstock, PA-C  Note - This record has been created using Dragon software.  Chart creation errors have  been sought, but may not always  have been located. Such creation errors do not reflect on  the standard of medical care.

## 2022-02-06 ENCOUNTER — Ambulatory Visit (INDEPENDENT_AMBULATORY_CARE_PROVIDER_SITE_OTHER): Payer: Medicare Other | Admitting: Physician Assistant

## 2022-02-06 ENCOUNTER — Ambulatory Visit: Payer: Medicare Other | Admitting: Physician Assistant

## 2022-02-06 ENCOUNTER — Other Ambulatory Visit: Payer: Self-pay

## 2022-02-06 ENCOUNTER — Encounter: Payer: Self-pay | Admitting: Physician Assistant

## 2022-02-06 VITALS — BP 101/69 | HR 86 | Ht 72.0 in | Wt 178.0 lb

## 2022-02-06 DIAGNOSIS — Z79899 Other long term (current) drug therapy: Secondary | ICD-10-CM

## 2022-02-06 DIAGNOSIS — M4854XS Collapsed vertebra, not elsewhere classified, thoracic region, sequela of fracture: Secondary | ICD-10-CM

## 2022-02-06 DIAGNOSIS — M1712 Unilateral primary osteoarthritis, left knee: Secondary | ICD-10-CM

## 2022-02-06 DIAGNOSIS — G629 Polyneuropathy, unspecified: Secondary | ICD-10-CM

## 2022-02-06 DIAGNOSIS — M5030 Other cervical disc degeneration, unspecified cervical region: Secondary | ICD-10-CM

## 2022-02-06 DIAGNOSIS — M7062 Trochanteric bursitis, left hip: Secondary | ICD-10-CM

## 2022-02-06 DIAGNOSIS — H5713 Ocular pain, bilateral: Secondary | ICD-10-CM

## 2022-02-06 DIAGNOSIS — G8929 Other chronic pain: Secondary | ICD-10-CM

## 2022-02-06 DIAGNOSIS — M450 Ankylosing spondylitis of multiple sites in spine: Secondary | ICD-10-CM | POA: Diagnosis not present

## 2022-02-06 DIAGNOSIS — M19041 Primary osteoarthritis, right hand: Secondary | ICD-10-CM | POA: Diagnosis not present

## 2022-02-06 DIAGNOSIS — E559 Vitamin D deficiency, unspecified: Secondary | ICD-10-CM

## 2022-02-06 DIAGNOSIS — Z87442 Personal history of urinary calculi: Secondary | ICD-10-CM

## 2022-02-06 DIAGNOSIS — R21 Rash and other nonspecific skin eruption: Secondary | ICD-10-CM

## 2022-02-06 DIAGNOSIS — K5792 Diverticulitis of intestine, part unspecified, without perforation or abscess without bleeding: Secondary | ICD-10-CM

## 2022-02-06 DIAGNOSIS — M19042 Primary osteoarthritis, left hand: Secondary | ICD-10-CM

## 2022-02-06 DIAGNOSIS — M25511 Pain in right shoulder: Secondary | ICD-10-CM | POA: Diagnosis not present

## 2022-02-06 DIAGNOSIS — Z8781 Personal history of (healed) traumatic fracture: Secondary | ICD-10-CM

## 2022-02-06 MED ORDER — TIZANIDINE HCL 4 MG PO TABS: 4.0000 mg | ORAL_TABLET | Freq: Every evening | ORAL | 2 refills | Status: DC | PRN

## 2022-02-06 NOTE — Addendum Note (Signed)
Addended by: Ellen Henri on: 02/06/2022 12:17 PM ? ? Modules accepted: Orders ? ?

## 2022-02-06 NOTE — Patient Instructions (Addendum)
Standing Labs We placed an order today for your standing lab work.   Please have your standing labs drawn in June and every 3 months   If possible, please have your labs drawn 2 weeks prior to your appointment so that the provider can discuss your results at your appointment.  Please note that you may see your imaging and lab results in MyChart before we have reviewed them. We may be awaiting multiple results to interpret others before contacting you. Please allow our office up to 72 hours to thoroughly review all of the results before contacting the office for clarification of your results.  We have open lab daily: Monday through Thursday from 1:30-4:30 PM and Friday from 1:30-4:00 PM at the office of Dr. Pollyann Savoy, [facility].   Please be advised, all patients with office appointments requiring lab work will take precedent over walk-in lab work.  If possible, please come for your lab work on Monday and Friday afternoons, as you may experience shorter wait times. The office is located at [address], [place], [place] [address] No appointment is necessary.   Labs are drawn by [facility]. Please bring your co-pay at the time of your lab draw.  You may receive a bill from [facility] for your lab work.  Please note if you are on Hydroxychloroquine and and an order has been placed for a Hydroxychloroquine level, you will need to have it drawn 4 hours or more after your last dose.  If you wish to have your labs drawn at another location, please call the office 24 hours in advance to send orders.  If you have any questions regarding directions or hours of operation,  please call [phone].   As a reminder, please drink plenty of water prior to coming for your lab work. Thanks!   Neck Exercises Ask your health care provider which exercises are safe for you. Do exercises exactly as told by your health care provider and adjust them as directed. It is normal to feel  mild stretching, pulling, tightness, or discomfort as you do these exercises. Stop right away if you feel sudden pain or your pain gets worse. Do not begin these exercises until told by your health care provider. Neck exercises can be important for many reasons. They can improve strength and maintain flexibility in your neck, which will help your upper back and prevent neck pain. Stretching exercises Rotation neck stretching  Sit in a chair or stand up. Place your feet flat on the floor, shoulder-width apart. Slowly turn your head (rotate) to the right until a slight stretch is felt. Turn it all the way to the right so you can look over your right shoulder. Do not tilt or tip your head. Hold this position for 10-30 seconds. Slowly turn your head (rotate) to the left until a slight stretch is felt. Turn it all the way to the left so you can look over your left shoulder. Do not tilt or tip your head. Hold this position for 10-30 seconds. Repeat __________ times. Complete this exercise __________ times a day. Neck retraction  Sit in a sturdy chair or stand up. Look straight ahead. Do not bend your neck. Use your fingers to push your chin backward (retraction). Do not bend your neck for this movement. Continue to face straight ahead. If you are doing the exercise properly, you will feel a slight sensation in your throat and a stretch at the back of your neck. Hold the stretch  for 1-2 seconds. Repeat __________ times. Complete this exercise __________ times a day. Strengthening exercises Neck press  Lie on your back on a firm bed or on the floor with a pillow under your head. Use your neck muscles to push your head down on the pillow and straighten your spine. Hold the position as well as you can. Keep your head facing up (in a neutral position) and your chin tucked. Slowly count to 5 while holding this position. Repeat __________ times. Complete this exercise __________ times a  day. Isometrics These are exercises in which you strengthen the muscles in your neck while keeping your neck still (isometrics). Sit in a supportive chair and place your hand on your forehead. Keep your head and face facing straight ahead. Do not flex or extend your neck while doing isometrics. Push forward with your head and neck while pushing back with your hand. Hold for 10 seconds. Do the sequence again, this time putting your hand against the back of your head. Use your head and neck to push backward against the hand pressure. Finally, do the same exercise on either side of your head, pushing sideways against the pressure of your hand. Repeat __________ times. Complete this exercise __________ times a day. Prone head lifts  Lie face-down (prone position), resting on your elbows so that your chest and upper back are raised. Start with your head facing downward, near your chest. Position your chin either on or near your chest. Slowly lift your head upward. Lift until you are looking straight ahead. Then continue lifting your head as far back as you can comfortably stretch. Hold your head up for 5 seconds. Then slowly lower it to your starting position. Repeat __________ times. Complete this exercise __________ times a day. Supine head lifts  Lie on your back (supine position), bending your knees to point to the ceiling and keeping your feet flat on the floor. Lift your head slowly off the floor, raising your chin toward your chest. Hold for 5 seconds. Repeat __________ times. Complete this exercise __________ times a day. Scapular retraction  Stand with your arms at your sides. Look straight ahead. Slowly pull both shoulders (scapulae) backward and downward (retraction) until you feel a stretch between your shoulder blades in your upper back. Hold for 10-30 seconds. Relax and repeat. Repeat __________ times. Complete this exercise __________ times a day. Contact a health care provider  if: Your neck pain or discomfort gets worse when you do an exercise. Your neck pain or discomfort does not improve within 2 hours after you exercise. If you have any of these problems, stop exercising right away. Do not do the exercises again unless your health care provider says that you can. Get help right away if: You develop sudden, severe neck pain. If this happens, stop exercising right away. Do not do the exercises again unless your health care provider says that you can. This information is not intended to replace advice given to you by your health care provider. Make sure you discuss any questions you have with your health care provider. Document Revised: 05/17/2021 Document Reviewed: 05/17/2021 Elsevier Patient Education  2022 [employer] Inc.   Hand Exercises Hand exercises can be helpful for almost anyone. These exercises can strengthen the hands, improve flexibility and movement, and increase blood flow to the hands. These results can make work and daily tasks easier. Hand exercises can be especially helpful for people who have joint pain from arthritis or have nerve damage from overuse (  carpal tunnel syndrome). These exercises can also help people who have injured a hand. Exercises Most of these hand exercises are gentle stretching and motion exercises. It is usually safe to do them often throughout the day. Warming up your hands before exercise may help to reduce stiffness. You can do this with gentle massage or by placing your hands in warm water for 10-15 minutes. It is normal to feel some stretching, pulling, tightness, or mild discomfort as you begin new exercises. This will gradually improve. Stop an exercise right away if you feel sudden, severe pain or your pain gets worse. Ask your health care provider which exercises are best for you. Knuckle bend or "claw" fist  Stand or sit with your arm, hand, and all five fingers pointed straight up. Make sure to keep your wrist  straight during the exercise. Gently bend your fingers down toward your palm until the tips of your fingers are touching the top of your palm. Keep your big knuckle straight and just bend the small knuckles in your fingers. Hold this position for __________ seconds. Straighten (extend) your fingers back to the starting position. Repeat this exercise 5-10 times with each hand. Full finger fist  Stand or sit with your arm, hand, and all five fingers pointed straight up. Make sure to keep your wrist straight during the exercise. Gently bend your fingers into your palm until the tips of your fingers are touching the middle of your palm. Hold this position for __________ seconds. Extend your fingers back to the starting position, stretching every joint fully. Repeat this exercise 5-10 times with each hand. Straight fist Stand or sit with your arm, hand, and all five fingers pointed straight up. Make sure to keep your wrist straight during the exercise. Gently bend your fingers at the big knuckle, where your fingers meet your hand, and the middle knuckle. Keep the knuckle at the tips of your fingers straight and try to touch the bottom of your palm. Hold this position for __________ seconds. Extend your fingers back to the starting position, stretching every joint fully. Repeat this exercise 5-10 times with each hand. Tabletop  Stand or sit with your arm, hand, and all five fingers pointed straight up. Make sure to keep your wrist straight during the exercise. Gently bend your fingers at the big knuckle, where your fingers meet your hand, as far down as you can while keeping the small knuckles in your fingers straight. Think of forming a tabletop with your fingers. Hold this position for __________ seconds. Extend your fingers back to the starting position, stretching every joint fully. Repeat this exercise 5-10 times with each hand. Finger spread  Place your hand flat on a table with your palm  facing down. Make sure your wrist stays straight as you do this exercise. Spread your fingers and thumb apart from each other as far as you can until you feel a gentle stretch. Hold this position for __________ seconds. Bring your fingers and thumb tight together again. Hold this position for __________ seconds. Repeat this exercise 5-10 times with each hand. Making circles  Stand or sit with your arm, hand, and all five fingers pointed straight up. Make sure to keep your wrist straight during the exercise. Make a circle by touching the tip of your thumb to the tip of your index finger. Hold for __________ seconds. Then open your hand wide. Repeat this motion with your thumb and each finger on your hand. Repeat this exercise 5-10 times  with each hand. Thumb motion  Sit with your forearm resting on a table and your wrist straight. Your thumb should be facing up toward the ceiling. Keep your fingers relaxed as you move your thumb. Lift your thumb up as high as you can toward the ceiling. Hold for __________ seconds. Bend your thumb across your palm as far as you can, reaching the tip of your thumb for the small finger (pinkie) side of your palm. Hold for __________ seconds. Repeat this exercise 5-10 times with each hand. Grip strengthening  Hold a stress ball or other soft ball in the middle of your hand. Slowly increase the pressure, squeezing the ball as much as you can without causing pain. Think of bringing the tips of your fingers into the middle of your palm. All of your finger joints should bend when doing this exercise. Hold your squeeze for __________ seconds, then relax. Repeat this exercise 5-10 times with each hand. Contact a health care provider if: Your hand pain or discomfort gets much worse when you do an exercise. Your hand pain or discomfort does not improve within 2 hours after you exercise. If you have any of these problems, stop doing these exercises right away. Do not do  them again unless your health care provider says that you can. Get help right away if: You develop sudden, severe hand pain or swelling. If this happens, stop doing these exercises right away. Do not do them again unless your health care provider says that you can. This information is not intended to replace advice given to you by your health care provider. Make sure you discuss any questions you have with your health care provider. Document Revised: 03/10/2021 Document Reviewed: 03/10/2021 Elsevier Patient Education  2022 [employer]

## 2022-02-07 LAB — COMPLETE METABOLIC PANEL WITH GFR
AG Ratio: 1.7 (calc) (ref 1.0–2.5)
ALT: 17 U/L (ref 9–46)
AST: 14 U/L (ref 10–35)
Albumin: 4.2 g/dL (ref 3.6–5.1)
Alkaline phosphatase (APISO): 75 U/L (ref 35–144)
BUN: 13 mg/dL (ref 7–25)
CO2: 28 mmol/L (ref 20–32)
Calcium: 9.8 mg/dL (ref 8.6–10.3)
Chloride: 104 mmol/L (ref 98–110)
Creat: 0.96 mg/dL (ref 0.70–1.30)
Globulin: 2.5 g/dL (calc) (ref 1.9–3.7)
Glucose, Bld: 93 mg/dL (ref 65–99)
Potassium: 5 mmol/L (ref 3.5–5.3)
Sodium: 138 mmol/L (ref 135–146)
Total Bilirubin: 0.7 mg/dL (ref 0.2–1.2)
Total Protein: 6.7 g/dL (ref 6.1–8.1)
eGFR: 96 mL/min/{1.73_m2} (ref >=60)

## 2022-02-07 LAB — CBC WITH DIFFERENTIAL/PLATELET
Absolute Monocytes: 537 cells/uL (ref 200–950)
Basophils Absolute: 43 cells/uL (ref 0–200)
Basophils Relative: 0.7 %
Eosinophils Absolute: 92 cells/uL (ref 15–500)
Eosinophils Relative: 1.5 %
HCT: 47.1 % (ref 38.5–50.0)
Hemoglobin: 15.9 g/dL (ref 13.2–17.1)
Lymphs Abs: 1623 cells/uL (ref 850–3900)
MCH: 33.1 pg — ABNORMAL HIGH (ref 27.0–33.0)
MCHC: 33.8 g/dL (ref 32.0–36.0)
MCV: 97.9 fL (ref 80.0–100.0)
MPV: 9.9 fL (ref 7.5–12.5)
Monocytes Relative: 8.8 %
Neutro Abs: 3806 cells/uL (ref 1500–7800)
Neutrophils Relative %: 62.4 %
Platelets: 249 10*3/uL (ref 140–400)
RBC: 4.81 10*6/uL (ref 4.20–5.80)
RDW: 11.4 % (ref 11.0–15.0)
Total Lymphocyte: 26.6 %
WBC: 6.1 10*3/uL (ref 3.8–10.8)

## 2022-02-07 NOTE — Progress Notes (Signed)
CBC and CMP WNL

## 2022-04-03 ENCOUNTER — Other Ambulatory Visit (HOSPITAL_COMMUNITY): Payer: Self-pay

## 2022-04-05 ENCOUNTER — Other Ambulatory Visit (HOSPITAL_COMMUNITY): Payer: Self-pay

## 2022-04-07 ENCOUNTER — Other Ambulatory Visit (HOSPITAL_COMMUNITY): Payer: Self-pay

## 2022-06-01 ENCOUNTER — Other Ambulatory Visit (HOSPITAL_COMMUNITY): Payer: Self-pay

## 2022-06-07 ENCOUNTER — Other Ambulatory Visit (HOSPITAL_COMMUNITY): Payer: Self-pay

## 2022-06-07 ENCOUNTER — Other Ambulatory Visit: Payer: Self-pay | Admitting: Rheumatology

## 2022-06-07 MED ORDER — ENBREL SURECLICK 50 MG/ML ~~LOC~~ SOAJ
SUBCUTANEOUS | 0 refills | Status: DC
  Filled 2022-06-07: qty 4, 28d supply, fill #0

## 2022-06-07 NOTE — Telephone Encounter (Signed)
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 Unable to leave a message, voicemail not set up.  Okay to refill Enbrel?

## 2022-06-09 ENCOUNTER — Other Ambulatory Visit (HOSPITAL_COMMUNITY): Payer: Self-pay

## 2022-06-15 ENCOUNTER — Telehealth: Payer: Self-pay | Admitting: Rheumatology

## 2022-06-15 NOTE — Telephone Encounter (Signed)
Spoke with patient and he states the pain in his back started about 3 weeks ago. Patient states He has had not falls or unusual activity. Patient states he went to bed and then woke up the next morning in pain. He states the pain is on his backbone between his shoulder blades. He states it progressed into having trouble turning his head. Patient states after a week it went away. Now the pain has returned and he states he is having difficulty walking and moving. He states he "feels pressure". Patient states he has tried Voltaren Gel which helps temporarily. Patient states he has also tried ice, Advil and Naproxen. Patient states he has not contacted ortho. Patient states he is taking his Enbrel as prescribed and the last injection was 06/13/2022. Please advise.

## 2022-06-15 NOTE — Telephone Encounter (Signed)
I reviewed the patients concerns with Dr. Corliss Skains.  She recommends urgent evaluation by PCP/urgent care to rule out a non-musculoskeletal etiology.  If he is feeling "pressure" other causes of back pain need to be ruled out.

## 2022-06-15 NOTE — Telephone Encounter (Signed)
Patient called stating he is experiencing pain in his upper back when he walks.  Patient states he can barely move and is hunched over.  He also feels like something is pressing on his spine.  Patient denies pain with sitting and laying down.  Patient requested a return call.

## 2022-06-15 NOTE — Telephone Encounter (Signed)
Patient advised Ladona Ridgel reviewed the patients concerns with Dr. Corliss Skains.  She recommends urgent evaluation by PCP/urgent care to rule out a non-musculoskeletal etiology.  If he is feeling "pressure" other causes of back pain need to be ruled out. Patient expressed understanding and will keep [place] updated.

## 2022-06-27 NOTE — Progress Notes (Deleted)
Office Visit Note  Patient: Benjamin Wiggins             Date of Birth: 08-Jun-1971           MRN: [id number]             PCP: Cheron Schaumann., MD Referring: Cheron Schaumann.,* Visit Date: 07/11/2022 Occupation: @GUAROCC @  Subjective:  No chief complaint on file.   History of Present Illness: Benjamin Wiggins is a 51 y.o. male ***   Activities of Daily Living:  Patient reports morning stiffness for *** {minute/hour:19697}.   Patient {ACTIONS;DENIES/REPORTS:21021675::"Denies"} nocturnal pain.  Difficulty dressing/grooming: {ACTIONS;DENIES/REPORTS:21021675::"Denies"} Difficulty climbing stairs: {ACTIONS;DENIES/REPORTS:21021675::"Denies"} Difficulty getting out of chair: {ACTIONS;DENIES/REPORTS:21021675::"Denies"} Difficulty using hands for taps, buttons, cutlery, and/or writing: {ACTIONS;DENIES/REPORTS:21021675::"Denies"}  No Rheumatology ROS completed.   PMFS History:  Patient Active Problem List   Diagnosis Date Noted  . Metatarsal fracture 06/14/2021  . DDD (degenerative disc disease), cervical 10/01/2018  . Ankylosing spondylitis (HCC) 11/07/2016  . High risk medication use 11/07/2016  . Neuropathy 02/23/2016  . Numbness 02/23/2016  . Facial numbness 02/23/2016  . Gait disorder 02/23/2016  . Memory changes 02/23/2016    Past Medical History:  Diagnosis Date  . Ankylosing spondylitis (HCC)   . Kidney stones    per patient   . Neuropathy     Family History  Problem Relation Age of Onset  . Diabetes Father   . Heart disease Father   . Neuropathy Neg Hx    Past Surgical History:  Procedure Laterality Date  . ANKLE SURGERY     titatium plate   Social History   Social History Narrative   Lives with 02/25/2016    Caffeine use: 3-4 cups per day   Immunization History  Administered Date(s) Administered  . Influenza-Unspecified 08/22/2017, 09/17/2018  . Pneumococcal Conjugate-13 09/17/2018  . Pneumococcal Polysaccharide-23  08/22/2017     Objective: Vital Signs: There were no vitals taken for this visit.   Physical Exam   Musculoskeletal Exam: ***  CDAI Exam: CDAI Score: -- Patient Global: --; Provider Global: -- Swollen: --; Tender: -- Joint Exam 07/11/2022   No joint exam has been documented for this visit   There is currently no information documented on the homunculus. Go to the Rheumatology activity and complete the homunculus joint exam.  Investigation: No additional findings.  Imaging: No results found.  Recent Labs: Lab Results  Component Value Date   WBC 6.1 02/06/2022   HGB 15.9 02/06/2022   PLT 249 02/06/2022   NA 138 02/06/2022   K 5.0 02/06/2022   CL 104 02/06/2022   CO2 28 02/06/2022   GLUCOSE 93 02/06/2022   BUN 13 02/06/2022   CREATININE 0.96 02/06/2022   BILITOT 0.7 02/06/2022   ALKPHOS 96 07/18/2017   AST 14 02/06/2022   ALT 17 02/06/2022   PROT 6.7 02/06/2022   ALBUMIN 4.3 07/18/2017   CALCIUM 9.8 02/06/2022   GFRAA 116 11/03/2020   QFTBGOLDPLUS NEGATIVE 11/09/2021    Speciality Comments: No specialty comments available.  Procedures:  No procedures performed Allergies: Iohexol and Iodinated contrast media   Assessment / Plan:     Visit Diagnoses: No diagnosis found.  Orders: No orders of the defined types were placed in this encounter.  No orders of the defined types were placed in this encounter.   Face-to-face time spent with patient was *** minutes. Greater than 50% of time was spent in counseling and coordination of care.  Follow-Up Instructions:  No follow-ups on file.   Earnestine Mealing, CMA  Note - This record has been created using Editor, commissioning.  Chart creation errors have been sought, but may not always  have been located. Such creation errors do not reflect on  the standard of medical care.

## 2022-07-03 ENCOUNTER — Other Ambulatory Visit: Payer: Self-pay | Admitting: Physician Assistant

## 2022-07-03 ENCOUNTER — Other Ambulatory Visit (HOSPITAL_COMMUNITY): Payer: Self-pay

## 2022-07-04 ENCOUNTER — Other Ambulatory Visit (HOSPITAL_COMMUNITY): Payer: Self-pay

## 2022-07-07 ENCOUNTER — Other Ambulatory Visit (HOSPITAL_COMMUNITY): Payer: Self-pay

## 2022-07-10 ENCOUNTER — Other Ambulatory Visit (HOSPITAL_COMMUNITY): Payer: Self-pay

## 2022-07-11 ENCOUNTER — Other Ambulatory Visit: Payer: Self-pay | Admitting: Rheumatology

## 2022-07-11 ENCOUNTER — Ambulatory Visit: Payer: Medicare Other | Admitting: Rheumatology

## 2022-07-11 DIAGNOSIS — M5030 Other cervical disc degeneration, unspecified cervical region: Secondary | ICD-10-CM

## 2022-07-11 DIAGNOSIS — M4854XS Collapsed vertebra, not elsewhere classified, thoracic region, sequela of fracture: Secondary | ICD-10-CM

## 2022-07-11 DIAGNOSIS — G8929 Other chronic pain: Secondary | ICD-10-CM

## 2022-07-11 DIAGNOSIS — M7062 Trochanteric bursitis, left hip: Secondary | ICD-10-CM

## 2022-07-11 DIAGNOSIS — Z8781 Personal history of (healed) traumatic fracture: Secondary | ICD-10-CM

## 2022-07-11 DIAGNOSIS — M1712 Unilateral primary osteoarthritis, left knee: Secondary | ICD-10-CM

## 2022-07-11 DIAGNOSIS — Z87442 Personal history of urinary calculi: Secondary | ICD-10-CM

## 2022-07-11 DIAGNOSIS — Z79899 Other long term (current) drug therapy: Secondary | ICD-10-CM

## 2022-07-11 DIAGNOSIS — K5792 Diverticulitis of intestine, part unspecified, without perforation or abscess without bleeding: Secondary | ICD-10-CM

## 2022-07-11 DIAGNOSIS — M19041 Primary osteoarthritis, right hand: Secondary | ICD-10-CM

## 2022-07-11 DIAGNOSIS — H5713 Ocular pain, bilateral: Secondary | ICD-10-CM

## 2022-07-11 DIAGNOSIS — E559 Vitamin D deficiency, unspecified: Secondary | ICD-10-CM

## 2022-07-11 DIAGNOSIS — M450 Ankylosing spondylitis of multiple sites in spine: Secondary | ICD-10-CM

## 2022-07-11 DIAGNOSIS — G629 Polyneuropathy, unspecified: Secondary | ICD-10-CM

## 2022-07-18 ENCOUNTER — Other Ambulatory Visit: Payer: Self-pay | Admitting: Rheumatology

## 2022-08-15 ENCOUNTER — Encounter: Payer: Self-pay | Admitting: Rheumatology

## 2022-08-15 ENCOUNTER — Other Ambulatory Visit: Payer: Self-pay | Admitting: Physician Assistant

## 2022-08-15 ENCOUNTER — Other Ambulatory Visit (HOSPITAL_COMMUNITY): Payer: Self-pay

## 2022-08-15 ENCOUNTER — Other Ambulatory Visit: Payer: Self-pay | Admitting: Rheumatology

## 2022-08-15 DIAGNOSIS — E559 Vitamin D deficiency, unspecified: Secondary | ICD-10-CM

## 2022-08-15 NOTE — Telephone Encounter (Signed)
I had a detailed conversation with Benjamin Wiggins.  He stated that his PCP advised him to see other doctors and he did not get any help.  He went to see a PA at an orthopedic clinic in [place] who told the patient that he had inflammation in his spine.  He was given oral prednisone and also intramuscular steroid.  He has noticed minimal improvement.  He was to come in for an appointment.  He states he had COVID-19 infection in August and he has skipped Enbrel for 2 weeks.  He also missed his appointment because of COVID-19 infection.  Patient states he continues to be in discomfort and would like to see me in the office.  Please schedule an appointment with me (next available).  Please let me know when the next available appointment is available.

## 2022-08-21 NOTE — Progress Notes (Deleted)
Office Visit Note  Patient: Benjamin Wiggins             Date of Birth: [birth year]           MRN: [id number]             PCP: Loraine Leriche., MD Referring: Loraine Leriche.,* Visit Date: 08/25/2022 Occupation: @GUAROCC @  Subjective:  No chief complaint on file.   History of Present Illness: Benjamin Wiggins is a 51 y.o. male ***   Activities of Daily Living:  Patient reports morning stiffness for *** {minute/hour:19697}.   Patient {ACTIONS;DENIES/REPORTS:21021675::"Denies"} nocturnal pain.  Difficulty dressing/grooming: {ACTIONS;DENIES/REPORTS:21021675::"Denies"} Difficulty climbing stairs: {ACTIONS;DENIES/REPORTS:21021675::"Denies"} Difficulty getting out of chair: {ACTIONS;DENIES/REPORTS:21021675::"Denies"} Difficulty using hands for taps, buttons, cutlery, and/or writing: {ACTIONS;DENIES/REPORTS:21021675::"Denies"}  No Rheumatology ROS completed.   PMFS History:  Patient Active Problem List   Diagnosis Date Noted  . Metatarsal fracture 06/14/2021  . DDD (degenerative disc disease), cervical 10/01/2018  . Ankylosing spondylitis ([place]) 11/07/2016  . High risk medication use 11/07/2016  . Neuropathy 02/23/2016  . Numbness 02/23/2016  . Facial numbness 02/23/2016  . Gait disorder 02/23/2016  . Memory changes 02/23/2016    Past Medical History:  Diagnosis Date  . Ankylosing spondylitis ([place])   . Kidney stones    per patient   . Neuropathy     Family History  Problem Relation Age of Onset  . Diabetes Father   . Heart disease Father   . Neuropathy Neg Hx    Past Surgical History:  Procedure Laterality Date  . ANKLE SURGERY     titatium plate   Social History   Social History Narrative   Lives with [name]    Caffeine use: 3-4 cups per day   Immunization History  Administered Date(s) Administered  . Influenza-Unspecified 08/22/2017, 09/17/2018  . Pneumococcal Conjugate-13 09/17/2018  . Pneumococcal Polysaccharide-23  08/22/2017     Objective: Vital Signs: There were no vitals taken for this visit.   Physical Exam   Musculoskeletal Exam: ***  CDAI Exam: CDAI Score: -- Patient Global: --; Provider Global: -- Swollen: --; Tender: -- Joint Exam 08/25/2022   No joint exam has been documented for this visit   There is currently no information documented on the homunculus. Go to the Rheumatology activity and complete the homunculus joint exam.  Investigation: No additional findings.  Imaging: No results found.  Recent Labs: Lab Results  Component Value Date   WBC 6.1 02/06/2022   HGB 15.9 02/06/2022   PLT 249 02/06/2022   NA 138 02/06/2022   K 5.0 02/06/2022   CL 104 02/06/2022   CO2 28 02/06/2022   GLUCOSE 93 02/06/2022   BUN 13 02/06/2022   CREATININE 0.96 02/06/2022   BILITOT 0.7 02/06/2022   ALKPHOS 96 07/18/2017   AST 14 02/06/2022   ALT 17 02/06/2022   PROT 6.7 02/06/2022   ALBUMIN 4.3 07/18/2017   CALCIUM 9.8 02/06/2022   GFRAA 116 11/03/2020   QFTBGOLDPLUS NEGATIVE 11/09/2021    Speciality Comments: No specialty comments available.  Procedures:  No procedures performed Allergies: Iohexol and Iodinated contrast media   Assessment / Plan:     Visit Diagnoses: No diagnosis found.  Orders: No orders of the defined types were placed in this encounter.  No orders of the defined types were placed in this encounter.   Face-to-face time spent with patient was *** minutes. Greater than 50% of time was spent in counseling and coordination of care.  Follow-Up Instructions:  No follow-ups on file.   Earnestine Mealing, CMA  Note - This record has been created using Editor, commissioning.  Chart creation errors have been sought, but may not always  have been located. Such creation errors do not reflect on  the standard of medical care.

## 2022-08-22 NOTE — Progress Notes (Unsigned)
Office Visit Note  Patient: Benjamin Wiggins             Date of Birth: 01/12/1971           MRN: [id number]             PCP: Loraine Leriche., MD Referring: Loraine Leriche.,* Visit Date: 08/23/2022 Occupation: @GUAROCC @  Subjective:  Pain in upper back  History of Present Illness: Benjamin Wiggins is a 51 y.o. male with history of ankylosing spondylitis.  He states he has been taking Enbrel on a regular basis.  He states he developed COVID-19 infection in August 2023.  At that time he off Enbrel for about 2 weeks.  He has been having pain and stiffness in his cervical spine for the last several months.  He states for the last few months he has been having pain in the thoracic spine.  He states the pain intermittently radiates to his subscapular region.  He also had an episode of left hip pain which resolved.  He continues to have some thoracic pain.  He has been taking her tramadol on a regular basis without much relief.  He also takes Zanaflex at bedtime but ran out of the medication.  He has been on gabapentin.  He states despite taking the medications he is in constant discomfort.  He was given a prednisone taper and a intramuscular steroid injection by his PCP 10 days ago.  He states he did not get much relief from that.   Activities of Daily Living:  Patient reports morning stiffness for all day. Patient Reports nocturnal pain.  Difficulty dressing/grooming: Reports Difficulty climbing stairs: Reports Difficulty getting out of chair: Reports Difficulty using hands for taps, buttons, cutlery, and/or writing: Reports  Review of Systems  Constitutional:  Positive for fatigue.  HENT:  Negative for mouth sores and mouth dryness.   Eyes:  Positive for dryness.  Respiratory:  Negative for shortness of breath.   Cardiovascular:  Positive for chest pain. Negative for palpitations.  Gastrointestinal:  Positive for constipation and diarrhea. Negative for blood in  stool.  Endocrine: Negative for increased urination.  Genitourinary:  Negative for involuntary urination.  Musculoskeletal:  Positive for joint pain, gait problem, joint pain, myalgias, morning stiffness, muscle tenderness and myalgias. Negative for joint swelling and muscle weakness.  Skin:  Negative for color change, hair loss and sensitivity to sunlight.  Allergic/Immunologic: Positive for susceptible to infections.  Neurological:  Positive for headaches. Negative for dizziness.  Hematological:  Negative for swollen glands.  Psychiatric/Behavioral:  Positive for sleep disturbance. Negative for depressed mood. The patient is nervous/anxious.     PMFS History:  Patient Active Problem List   Diagnosis Date Noted   Metatarsal fracture 06/14/2021   DDD (degenerative disc disease), cervical 10/01/2018   Ankylosing spondylitis ([place]) 11/07/2016   High risk medication use 11/07/2016   Neuropathy 02/23/2016   Numbness 02/23/2016   Facial numbness 02/23/2016   Gait disorder 02/23/2016   Memory changes 02/23/2016    Past Medical History:  Diagnosis Date   Ankylosing spondylitis ([place])    Kidney stones    per patient    Neuropathy     Family History  Problem Relation Age of Onset   Diabetes Father    Heart disease Father    Neuropathy Neg Hx    Past Surgical History:  Procedure Laterality Date   ANKLE SURGERY     titatium plate   Social History  Social History Narrative   Lives with [name]    Caffeine use: 3-4 cups per day   Immunization History  Administered Date(s) Administered   Influenza-Unspecified 08/22/2017, 09/17/2018   Pneumococcal Conjugate-13 09/17/2018   Pneumococcal Polysaccharide-23 08/22/2017     Objective: Vital Signs: BP 123/76 (BP Location: Left Arm, Patient Position: Sitting, Cuff Size: Normal)   Pulse 73   Resp 16   Ht 6' (1.829 m)   Wt 178 lb 12.8 oz (81.1 kg)   BMI 24.25 kg/m    Physical Exam Vitals and nursing note reviewed.   Constitutional:      Appearance: He is well-developed.  HENT:     Head: Normocephalic and atraumatic.  Eyes:     Conjunctiva/sclera: Conjunctivae normal.     Pupils: Pupils are equal, round, and reactive to light.  Cardiovascular:     Rate and Rhythm: Normal rate and regular rhythm.     Heart sounds: Normal heart sounds.  Pulmonary:     Effort: Pulmonary effort is normal.     Breath sounds: Normal breath sounds.  Abdominal:     General: Bowel sounds are normal.     Palpations: Abdomen is soft.  Musculoskeletal:     Cervical back: Normal range of motion and neck supple.  Skin:    General: Skin is warm and dry.     Capillary Refill: Capillary refill takes less than 2 seconds.  Neurological:     Mental Status: He is alert and oriented to person, place, and time.  Psychiatric:        Behavior: Behavior normal.      Musculoskeletal Exam: He had limited lateral rotation of the cervical spine with discomfort.  He had tenderness over the lower cervical region and also over the mid thoracic region.  He had limited range of motion of the thoracic spine with flexion extension and lateral rotation.  He had limited range of motion of the lumbar spine.  He had no tenderness over SI joints.  Shoulder joints, elbow joints, wrist joints, MCPs PIPs and DIPs were in good range of motion with no synovitis.  Bilateral PIP and DIP thickening was noted.  Hip joints and knee joints were in good range of motion without any warmth swelling or effusion.  There was no tenderness over ankles or MTPs.  CDAI Exam: CDAI Score: -- Patient Global: --; Provider Global: -- Swollen: --; Tender: -- Joint Exam 08/23/2022   No joint exam has been documented for this visit   There is currently no information documented on the homunculus. Go to the Rheumatology activity and complete the homunculus joint exam.  Investigation: No additional findings.  Imaging: No results found.  Recent Labs: Lab Results   Component Value Date   WBC 6.1 02/06/2022   HGB 15.9 02/06/2022   PLT 249 02/06/2022   NA 138 02/06/2022   K 5.0 02/06/2022   CL 104 02/06/2022   CO2 28 02/06/2022   GLUCOSE 93 02/06/2022   BUN 13 02/06/2022   CREATININE 0.96 02/06/2022   BILITOT 0.7 02/06/2022   ALKPHOS 96 07/18/2017   AST 14 02/06/2022   ALT 17 02/06/2022   PROT 6.7 02/06/2022   ALBUMIN 4.3 07/18/2017   CALCIUM 9.8 02/06/2022   GFRAA 116 11/03/2020   QFTBGOLDPLUS NEGATIVE 11/09/2021    Speciality Comments: No specialty comments available.  Procedures:  No procedures performed Allergies: Iohexol and Iodinated contrast media   Assessment / Plan:     Visit Diagnoses: Ankylosing spondylitis of  multiple sites in spine (HCC)-patient states that he continues to have pain in his entire spine.  He believes that ankylosing spondylitis has been stable but the pain in his neck and thoracic spine is different.  He has intermittent flares of neck and thoracic pain.  He denies any SI joint discomfort.  He denies any history of joint swelling.  He had interruption in the treatment with Enbrel when he had COVID-19 infection in August for couple of weeks.  He resumed Enbrel after the infection resolved.  He recently had a cortisone injection and a prednisone taper by his PCP without any benefit.  High risk medication use - Enbrel SureClick 50 mg sq injections every 7 days.  - Plan: CBC with Differential/Platelet, COMPLETE METABOLIC PANEL WITH GFR today and then every 3 months.  Information on immunization was placed in the AVS.  He was advised to hold Enbrel if he develops an infection and resume after the infection resolves.  He states he has been getting annual skin examinations and also has been using sun protection.  Neck pain-he had pain in stiffness with range of motion of the cervical spine and tenderness over the C7-T1 region.  I will obtain x-rays of the cervical spine.  DDD (degenerative disc disease), cervical-he is  known history of degenerative disease of cervical spine.  Pain in thoracic spine-he had tenderness over the entire thoracic spine.  He states the thoracic pain has been radiating into the rib cage area intermittently.  We will obtain x-rays of the thoracic spine.  He wants to hold off on physical therapy at this point.  He states that the pain is severe at times that it interferes with his daily activities.  He states the pain is not controlled with the current combination of medications including tramadol.  I discussed the possibility of referring him to pain management clinic.  He stated that he would let me know if he wants referral placed in the pain clinic.  Chronic right shoulder pain-he had good range of motion without discomfort today.  Primary osteoarthritis of both hands-he had PIP and DIP thickening but no synovitis was noted.  Primary osteoarthritis of left knee-he has intermittent discomfort in his left knee.  No warmth swelling or effusion was noted.  Other medical problems listed as follows:  Nontraumatic compression fracture of T12 vertebra, sequela  Neuropathy  Diverticulitis  Vitamin D deficiency  History of foot fracture  History of kidney stones  Orders: Orders Placed This Encounter  Procedures   DG Cervical Spine Complete   CBC with Differential/Platelet   COMPLETE METABOLIC PANEL WITH GFR   Sedimentation rate   Meds ordered this encounter  Medications   tiZANidine (ZANAFLEX) 4 MG tablet    Sig: Take 1 tablet (4 mg total) by mouth at bedtime as needed.    Dispense:  30 tablet    Refill:  2     Follow-Up Instructions: Return in about 5 months (around 01/23/2023) for Ankylosing spondylitis.   Bo Merino, MD  Note - This record has been created using Editor, commissioning.  Chart creation errors have been sought, but may not always  have been located. Such creation errors do not reflect on  the standard of medical care.

## 2022-08-23 ENCOUNTER — Ambulatory Visit (INDEPENDENT_AMBULATORY_CARE_PROVIDER_SITE_OTHER): Payer: Medicare Other | Admitting: Rheumatology

## 2022-08-23 ENCOUNTER — Telehealth: Payer: Self-pay | Admitting: Rheumatology

## 2022-08-23 ENCOUNTER — Encounter: Payer: Self-pay | Admitting: Rheumatology

## 2022-08-23 ENCOUNTER — Ambulatory Visit (HOSPITAL_COMMUNITY)
Admission: RE | Admit: 2022-08-23 | Discharge: 2022-08-23 | Disposition: A | Discharge status: Home / Self Care | Payer: Medicare Other | Source: Ambulatory Visit | Attending: Rheumatology | Admitting: Rheumatology

## 2022-08-23 ENCOUNTER — Other Ambulatory Visit: Payer: Self-pay | Admitting: Rheumatology

## 2022-08-23 VITALS — BP 123/76 | HR 73 | Resp 16 | Ht 72.0 in | Wt 178.8 lb

## 2022-08-23 DIAGNOSIS — M542 Cervicalgia: Secondary | ICD-10-CM | POA: Insufficient documentation

## 2022-08-23 DIAGNOSIS — Z87442 Personal history of urinary calculi: Secondary | ICD-10-CM

## 2022-08-23 DIAGNOSIS — M19041 Primary osteoarthritis, right hand: Secondary | ICD-10-CM

## 2022-08-23 DIAGNOSIS — M4854XS Collapsed vertebra, not elsewhere classified, thoracic region, sequela of fracture: Secondary | ICD-10-CM | POA: Diagnosis present

## 2022-08-23 DIAGNOSIS — M25511 Pain in right shoulder: Secondary | ICD-10-CM | POA: Insufficient documentation

## 2022-08-23 DIAGNOSIS — M7062 Trochanteric bursitis, left hip: Secondary | ICD-10-CM

## 2022-08-23 DIAGNOSIS — M19042 Primary osteoarthritis, left hand: Secondary | ICD-10-CM

## 2022-08-23 DIAGNOSIS — M1712 Unilateral primary osteoarthritis, left knee: Secondary | ICD-10-CM

## 2022-08-23 DIAGNOSIS — K5792 Diverticulitis of intestine, part unspecified, without perforation or abscess without bleeding: Secondary | ICD-10-CM | POA: Insufficient documentation

## 2022-08-23 DIAGNOSIS — H5713 Ocular pain, bilateral: Secondary | ICD-10-CM

## 2022-08-23 DIAGNOSIS — E559 Vitamin D deficiency, unspecified: Secondary | ICD-10-CM | POA: Insufficient documentation

## 2022-08-23 DIAGNOSIS — M450 Ankylosing spondylitis of multiple sites in spine: Secondary | ICD-10-CM

## 2022-08-23 DIAGNOSIS — M546 Pain in thoracic spine: Secondary | ICD-10-CM

## 2022-08-23 DIAGNOSIS — G629 Polyneuropathy, unspecified: Secondary | ICD-10-CM

## 2022-08-23 DIAGNOSIS — Z79899 Other long term (current) drug therapy: Secondary | ICD-10-CM | POA: Diagnosis not present

## 2022-08-23 DIAGNOSIS — M5030 Other cervical disc degeneration, unspecified cervical region: Secondary | ICD-10-CM

## 2022-08-23 DIAGNOSIS — Z8781 Personal history of (healed) traumatic fracture: Secondary | ICD-10-CM

## 2022-08-23 DIAGNOSIS — G8929 Other chronic pain: Secondary | ICD-10-CM

## 2022-08-23 MED ORDER — TIZANIDINE HCL 4 MG PO TABS: 4.0000 mg | ORAL_TABLET | Freq: Every evening | ORAL | 2 refills | Status: DC | PRN

## 2022-08-23 NOTE — Telephone Encounter (Signed)
Patient requested a prescription refill of Enbrel to be sent to [facility].

## 2022-08-23 NOTE — Patient Instructions (Signed)
Standing Labs We placed an order today for your standing lab work.   Please have your standing labs drawn in December and every 3 months  If possible, please have your labs drawn 2 weeks prior to your appointment so that the provider can discuss your results at your appointment.  Please note that you may see your imaging and lab results in MyChart before we have reviewed them. We may be awaiting multiple results to interpret others before contacting you. Please allow our office up to 72 hours to thoroughly review all of the results before contacting the office for clarification of your results.  We currently have open lab daily: Monday through Thursday from 1:30 PM-4:30 PM and Friday from 1:30 PM- 4:00 PM If possible, please come for your lab work on Monday, Thursday or Friday afternoons, as you may experience shorter wait times.   Effective October 04, 2022 the new lab hours will change to: Monday through Thursday from 1:30 PM-5:00 PM and Friday from 8:30 AM-12:00 PM If possible, please come for your lab work on Monday and Thursday afternoons, as you may experience shorter wait times.  Please be advised, all patients with office appointments requiring lab work will take precedent over walk-in lab work.    The office is located at [address], [address], [place], [place] [address] No appointment is necessary.   Labs are drawn by [facility]. Please bring your co-pay at the time of your lab draw.  You may receive a bill from [facility] for your lab work.  Please note if you are on Hydroxychloroquine and and an order has been placed for a Hydroxychloroquine level, you will need to have it drawn 4 hours or more after your last dose.  If you wish to have your labs drawn at another location, please call the office 24 hours in advance to send orders.  If you have any questions regarding directions or hours of operation,  please call [phone].   As a reminder, please drink plenty of water prior  to coming for your lab work. Thanks!   Vaccines You are taking a medication(s) that can suppress your immune system.  The following immunizations are recommended: Flu annually Covid-19  Td/Tdap (tetanus, diphtheria, pertussis) every 10 years Pneumonia (Prevnar 15 then Pneumovax 23 at least 1 year apart.  Alternatively, can take Prevnar 20 without needing additional dose) Shingrix: 2 doses from 4 weeks to 6 months apart  Please check with your PCP to make sure you are up to date.   If you have signs or symptoms of an infection or start antibiotics: First, call your PCP for workup of your infection. Hold your medication through the infection, until you complete your antibiotics, and until symptoms resolve if you take the following: Injectable medication (Actemra, Benlysta, Cimzia, Cosentyx, Enbrel, Humira, Kevzara, Orencia, Remicade, Simponi, Stelara, Taltz, Tremfya) Methotrexate Leflunomide (Arava) Mycophenolate (Cellcept) Xeljanz, Olumiant, or Rinvoq  

## 2022-08-24 ENCOUNTER — Other Ambulatory Visit (HOSPITAL_COMMUNITY): Payer: Self-pay

## 2022-08-24 ENCOUNTER — Other Ambulatory Visit: Payer: Self-pay | Admitting: *Deleted

## 2022-08-24 LAB — COMPLETE METABOLIC PANEL WITH GFR
AG Ratio: 1.4 (calc) (ref 1.0–2.5)
ALT: 22 U/L (ref 9–46)
AST: 10 U/L (ref 10–35)
Albumin: 4.1 g/dL (ref 3.6–5.1)
Alkaline phosphatase (APISO): 93 U/L (ref 35–144)
BUN: 17 mg/dL (ref 7–25)
CO2: 30 mmol/L (ref 20–32)
Calcium: 9.4 mg/dL (ref 8.6–10.3)
Chloride: 102 mmol/L (ref 98–110)
Creat: 0.89 mg/dL (ref 0.70–1.30)
Globulin: 2.9 g/dL (calc) (ref 1.9–3.7)
Glucose, Bld: 93 mg/dL (ref 65–99)
Potassium: 4.9 mmol/L (ref 3.5–5.3)
Sodium: 138 mmol/L (ref 135–146)
Total Bilirubin: 0.5 mg/dL (ref 0.2–1.2)
Total Protein: 7 g/dL (ref 6.1–8.1)
eGFR: 104 mL/min/{1.73_m2} (ref >=60)

## 2022-08-24 LAB — CBC WITH DIFFERENTIAL/PLATELET
Absolute Monocytes: 684 cells/uL (ref 200–950)
Basophils Absolute: 72 cells/uL (ref 0–200)
Basophils Relative: 1 %
Eosinophils Absolute: 230 cells/uL (ref 15–500)
Eosinophils Relative: 3.2 %
HCT: 45.9 % (ref 38.5–50.0)
Hemoglobin: 15.8 g/dL (ref 13.2–17.1)
Lymphs Abs: 2009 cells/uL (ref 850–3900)
MCH: 33.4 pg — ABNORMAL HIGH (ref 27.0–33.0)
MCHC: 34.4 g/dL (ref 32.0–36.0)
MCV: 97 fL (ref 80.0–100.0)
MPV: 9.1 fL (ref 7.5–12.5)
Monocytes Relative: 9.5 %
Neutro Abs: 4205 cells/uL (ref 1500–7800)
Neutrophils Relative %: 58.4 %
Platelets: 262 10*3/uL (ref 140–400)
RBC: 4.73 10*6/uL (ref 4.20–5.80)
RDW: 11.7 % (ref 11.0–15.0)
Total Lymphocyte: 27.9 %
WBC: 7.2 10*3/uL (ref 3.8–10.8)

## 2022-08-24 LAB — SEDIMENTATION RATE: Sed Rate: 11 mm/h (ref 0–20)

## 2022-08-24 MED ORDER — ENBREL SURECLICK 50 MG/ML ~~LOC~~ SOAJ
SUBCUTANEOUS | 2 refills | Status: DC
  Filled 2022-08-24: qty 4, 28d supply, fill #0
  Filled 2022-08-24: qty 4, fill #0
  Filled 2022-09-18: qty 4, 28d supply, fill #1
  Filled 2022-10-18: qty 4, 28d supply, fill #2

## 2022-08-24 NOTE — Telephone Encounter (Signed)
Next Visit: 01/23/2023  Last Visit: 08/23/2022  Last Fill: 06/07/2022  DX:Ankylosing spondylitis of multiple sites in spine   Current Dose per office note 3/55/9741: Enbrel SureClick 50 mg sq injections every 7 days  Labs: 08/23/2022 [facility] 33.4,   TB Gold: 11/09/2021   TB Gold is negative.  Okay to refill Enbrel?

## 2022-08-25 ENCOUNTER — Other Ambulatory Visit: Payer: Self-pay

## 2022-08-25 ENCOUNTER — Ambulatory Visit: Payer: Medicare Other | Admitting: Rheumatology

## 2022-08-25 DIAGNOSIS — M450 Ankylosing spondylitis of multiple sites in spine: Secondary | ICD-10-CM

## 2022-08-25 DIAGNOSIS — Z79899 Other long term (current) drug therapy: Secondary | ICD-10-CM

## 2022-08-25 DIAGNOSIS — G8929 Other chronic pain: Secondary | ICD-10-CM

## 2022-08-25 DIAGNOSIS — M67912 Unspecified disorder of synovium and tendon, left shoulder: Secondary | ICD-10-CM

## 2022-08-25 DIAGNOSIS — E559 Vitamin D deficiency, unspecified: Secondary | ICD-10-CM

## 2022-08-25 DIAGNOSIS — M5030 Other cervical disc degeneration, unspecified cervical region: Secondary | ICD-10-CM

## 2022-08-25 DIAGNOSIS — Z87442 Personal history of urinary calculi: Secondary | ICD-10-CM

## 2022-08-25 DIAGNOSIS — K5792 Diverticulitis of intestine, part unspecified, without perforation or abscess without bleeding: Secondary | ICD-10-CM

## 2022-08-25 DIAGNOSIS — M19041 Primary osteoarthritis, right hand: Secondary | ICD-10-CM

## 2022-08-25 DIAGNOSIS — M542 Cervicalgia: Secondary | ICD-10-CM

## 2022-08-25 DIAGNOSIS — H5713 Ocular pain, bilateral: Secondary | ICD-10-CM

## 2022-08-25 DIAGNOSIS — M4854XS Collapsed vertebra, not elsewhere classified, thoracic region, sequela of fracture: Secondary | ICD-10-CM

## 2022-08-25 DIAGNOSIS — M546 Pain in thoracic spine: Secondary | ICD-10-CM

## 2022-08-25 DIAGNOSIS — M1712 Unilateral primary osteoarthritis, left knee: Secondary | ICD-10-CM

## 2022-08-25 DIAGNOSIS — M7062 Trochanteric bursitis, left hip: Secondary | ICD-10-CM

## 2022-08-25 DIAGNOSIS — Z8781 Personal history of (healed) traumatic fracture: Secondary | ICD-10-CM

## 2022-08-25 DIAGNOSIS — G629 Polyneuropathy, unspecified: Secondary | ICD-10-CM

## 2022-08-25 NOTE — Progress Notes (Signed)
If patient would like we can refer him to physical therapy or pain management.

## 2022-08-25 NOTE — Progress Notes (Signed)
X-rays showed mild degenerative changes.  No fracture was noted per radiology report.

## 2022-08-25 NOTE — Progress Notes (Signed)
X-rays showed ankylosing spondylitis involving the entire thoracic spine.  No fracture was noted per radiology report

## 2022-08-30 ENCOUNTER — Other Ambulatory Visit (HOSPITAL_COMMUNITY): Payer: Self-pay

## 2022-09-14 ENCOUNTER — Other Ambulatory Visit (HOSPITAL_COMMUNITY): Payer: Self-pay

## 2022-09-18 ENCOUNTER — Other Ambulatory Visit (HOSPITAL_COMMUNITY): Payer: Self-pay

## 2022-09-21 ENCOUNTER — Other Ambulatory Visit (HOSPITAL_COMMUNITY): Payer: Self-pay

## 2022-10-12 ENCOUNTER — Other Ambulatory Visit (HOSPITAL_COMMUNITY): Payer: Self-pay

## 2022-10-16 ENCOUNTER — Other Ambulatory Visit (HOSPITAL_COMMUNITY): Payer: Self-pay

## 2022-10-18 ENCOUNTER — Other Ambulatory Visit (HOSPITAL_COMMUNITY): Payer: Self-pay

## 2022-10-27 ENCOUNTER — Other Ambulatory Visit (HOSPITAL_COMMUNITY): Payer: Self-pay

## 2022-10-30 ENCOUNTER — Other Ambulatory Visit (HOSPITAL_COMMUNITY): Payer: Self-pay

## 2022-11-07 ENCOUNTER — Other Ambulatory Visit (HOSPITAL_COMMUNITY): Payer: Self-pay

## 2022-11-17 ENCOUNTER — Other Ambulatory Visit: Payer: Self-pay | Admitting: Rheumatology

## 2022-11-17 ENCOUNTER — Other Ambulatory Visit: Payer: Self-pay

## 2022-11-17 ENCOUNTER — Other Ambulatory Visit (HOSPITAL_COMMUNITY): Payer: Self-pay

## 2022-11-17 MED ORDER — ENBREL SURECLICK 50 MG/ML ~~LOC~~ SOAJ
SUBCUTANEOUS | 0 refills | Status: DC
  Filled 2022-11-17: qty 4, 28d supply, fill #0

## 2022-11-17 NOTE — Telephone Encounter (Signed)
Next Visit: 01/23/2023   Last Visit: 08/23/2022   Last Fill: 08/24/2022   DX:Ankylosing spondylitis of multiple sites in spine    Current Dose per office note 08/23/2022: Enbrel SureClick 50 mg sq injections every 7 days   Labs: 08/23/2022 [facility] 33.4,    TB Gold: 11/09/2021   TB Gold is negative.   Okay to refill Enbrel?

## 2022-11-22 ENCOUNTER — Other Ambulatory Visit: Payer: Self-pay

## 2022-12-18 ENCOUNTER — Other Ambulatory Visit (HOSPITAL_COMMUNITY): Payer: Self-pay

## 2022-12-19 ENCOUNTER — Other Ambulatory Visit: Payer: Self-pay | Admitting: Rheumatology

## 2022-12-19 ENCOUNTER — Other Ambulatory Visit (HOSPITAL_COMMUNITY): Payer: Self-pay

## 2022-12-21 ENCOUNTER — Other Ambulatory Visit (HOSPITAL_COMMUNITY): Payer: Self-pay

## 2023-01-05 ENCOUNTER — Other Ambulatory Visit (HOSPITAL_COMMUNITY): Payer: Self-pay

## 2023-01-09 NOTE — Progress Notes (Unsigned)
Office Visit Note  Patient: Benjamin Wiggins             Date of Birth: 05-19-1971           MRN: [id number]             PCP: Loraine Leriche., MD Referring: Loraine Leriche.,* Visit Date: 01/23/2023 Occupation: @GUAROCC$ @  Subjective:  Stable   History of Present Illness: Benjamin Wiggins is a 52 y.o. male with history of ankylosing spondylitis, osteoarthritis, and DDD.   Patient is currently on enbrel 50 mg sq injections once weekly.  He has been tolerating Enbrel without any side effects or injection site reactions.  Patient reports that overall his ankylosing spondylitis has been stable.  He has intermittent pain and stiffness in multiple joints.  He denies any joint swelling currently. He has C-spine stiffness intermittently. He takes tylenol and advil PRN for symptomatic relief.  He denies any achilles tendonitis or plantar fasciitis at this time.   He denies any recurrent infections.  He continues to have significantly dry skin on his forearms and his he has been evaluated by dermatology in the past and was told that it is due to sun damage.  He was encouraged to follow back up with dermatology for further evaluation.    Activities of Daily Living:  Patient reports morning stiffness for 30-60 minutes.   Patient Reports nocturnal pain.  Difficulty dressing/grooming: Reports Difficulty climbing stairs: Reports Difficulty getting out of chair: Reports Difficulty using hands for taps, buttons, cutlery, and/or writing: Reports  Review of Systems  Constitutional:  Positive for fatigue. Negative for night sweats.  HENT:  Positive for mouth dryness. Negative for mouth sores and nose dryness.   Eyes:  Negative for redness and dryness.  Respiratory:  Negative for shortness of breath and difficulty breathing.   Cardiovascular:  Negative for chest pain, palpitations, hypertension, irregular heartbeat and swelling in legs/feet.  Gastrointestinal:  Positive for  diarrhea. Negative for constipation.  Endocrine: Negative for increased urination.  Genitourinary:  Negative for involuntary urination.  Musculoskeletal:  Positive for joint pain, gait problem, joint pain, joint swelling, muscle weakness, morning stiffness and muscle tenderness. Negative for myalgias and myalgias.  Skin:  Positive for rash, hair loss and sensitivity to sunlight. Negative for color change, pallor, nodules/bumps, skin tightness and ulcers.  Allergic/Immunologic: Positive for susceptible to infections.  Neurological:  Positive for weakness. Negative for dizziness, fainting, headaches, memory loss and night sweats.  Hematological: Negative.  Negative for swollen glands.  Psychiatric/Behavioral:  Positive for depressed mood and sleep disturbance. The patient is nervous/anxious.     PMFS History:  Patient Active Problem List   Diagnosis Date Noted   Metatarsal fracture 06/14/2021   DDD (degenerative disc disease), cervical 10/01/2018   Ankylosing spondylitis ([place]) 11/07/2016   High risk medication use 11/07/2016   Neuropathy 02/23/2016   Numbness 02/23/2016   Facial numbness 02/23/2016   Gait disorder 02/23/2016   Memory changes 02/23/2016    Past Medical History:  Diagnosis Date   Ankylosing spondylitis ([place])    Kidney stones    per patient    Neuropathy     Family History  Problem Relation Age of Onset   Diabetes Father    Heart disease Father    Neuropathy Neg Hx    Past Surgical History:  Procedure Laterality Date   ANKLE SURGERY     titatium plate   Social History   Social History Narrative  Lives with [name]    Caffeine use: 3-4 cups per day   Immunization History  Administered Date(s) Administered   Influenza-Unspecified 08/22/2017, 09/17/2018   Pneumococcal Conjugate-13 09/17/2018   Pneumococcal Polysaccharide-23 08/22/2017     Objective: Vital Signs: BP 113/76 (BP Location: Right Arm, Patient Position: Sitting, Cuff Size: Large)    Pulse 80   Resp 16   Ht 6' (1.829 m)   Wt 180 lb 3.2 oz (81.7 kg)   BMI 24.44 kg/m    Physical Exam Vitals and nursing note reviewed.  Constitutional:      Appearance: He is well-developed.  HENT:     Head: Normocephalic and atraumatic.  Eyes:     Conjunctiva/sclera: Conjunctivae normal.     Pupils: Pupils are equal, round, and reactive to light.  Cardiovascular:     Rate and Rhythm: Normal rate and regular rhythm.     Heart sounds: Normal heart sounds.  Pulmonary:     Effort: Pulmonary effort is normal.     Breath sounds: Normal breath sounds.  Abdominal:     General: Bowel sounds are normal.     Palpations: Abdomen is soft.  Musculoskeletal:     Cervical back: Normal range of motion and neck supple.  Skin:    General: Skin is warm and dry.     Capillary Refill: Capillary refill takes less than 2 seconds.  Neurological:     Mental Status: He is alert and oriented to person, place, and time.  Psychiatric:        Behavior: Behavior normal.      Musculoskeletal Exam: C-spine has slightly limited range of motion with lateral rotation.  No midline spinal tenderness or SI joint tenderness currently.  Shoulder joints have good range of motion with no discomfort currently.  Elbow joints, wrist joints, MCPs, PIPs, DIPs have good range of motion with no synovitis.  Complete fist formation bilaterally.  Hip joints have good range of motion with no groin pain.  Knee joints have good range of motion with no warmth or effusion.  Ankle joints have good range of motion with no tenderness or joint swelling.  No evidence of Achilles tendinitis or plantar fasciitis.  CDAI Exam: CDAI Score: -- Patient Global: --; Provider Global: -- Swollen: --; Tender: -- Joint Exam 01/23/2023   No joint exam has been documented for this visit   There is currently no information documented on the homunculus. Go to the Rheumatology activity and complete the homunculus joint exam.  Investigation: No  additional findings.  Imaging: No results found.  Recent Labs: Lab Results  Component Value Date   WBC 7.2 08/23/2022   HGB 15.8 08/23/2022   PLT 262 08/23/2022   NA 138 08/23/2022   K 4.9 08/23/2022   CL 102 08/23/2022   CO2 30 08/23/2022   GLUCOSE 93 08/23/2022   BUN 17 08/23/2022   CREATININE 0.89 08/23/2022   BILITOT 0.5 08/23/2022   ALKPHOS 96 07/18/2017   AST 10 08/23/2022   ALT 22 08/23/2022   PROT 7.0 08/23/2022   ALBUMIN 4.3 07/18/2017   CALCIUM 9.4 08/23/2022   GFRAA 116 11/03/2020   QFTBGOLDPLUS NEGATIVE 11/09/2021    Speciality Comments: No specialty comments available.  Procedures:  No procedures performed Allergies: Iohexol and Iodinated contrast media    Assessment / Plan:     Visit Diagnoses: Ankylosing spondylitis of multiple sites in spine [facility]): Stable.  He remains on Enbrel 50 mg subcutaneous injections once weekly.  He continues to  tolerate Enbrel without any side effects or injection site reactions.  He has no synovitis or dactylitis on examination today.  No evidence of Achilles tendinitis or plantar fasciitis.  No SI joint tenderness upon palpation.  He continues to experience intermittent stiffness in the C-spine and thoracic spine.  No midline spinal tenderness currently.  He will remain on Enbrel as prescribed.  A refill of Enbrel be sent to the pharmacy pending lab results.   He requested a form to renew his handicap placard which has been helpful in the past.  He will follow-up in the office in 5 months or sooner if needed.  High risk medication use - Enbrel SureClick 50 mg sq injections every 7 days.  CBC and CMP updated on 08/23/22.  Orders for CBC and CMP released today.  His next lab work will be due in May and every 3 months to monitor for drug toxicity. TB gold negative on 11/09/21.  Order for TB gold released today.  Discussed the importance of holding enbrel if he develops signs or symptoms of an infection and to resume once the infection  has completely cleared.  Discussed the importance of yearly skin examinations while on Enbrel.   - Plan: COMPLETE METABOLIC PANEL WITH GFR, CBC with Differential/Platelet, QuantiFERON-TB Gold Plus  Screening for tuberculosis - Order for TB gold released today. Plan: QuantiFERON-TB Gold Plus  DDD (degenerative disc disease), cervical: He experiences intermittent pain and stiffness in the C-spine.  At times he has difficulty with lateral rotation.  He has no symptoms of radiculopathy at this time.  Pain in thoracic spine: Midline spinal tenderness in the thoracic region currently.  Chronic right shoulder pain: He experiences intermittent discomfort and stiffness in both shoulders.  He declined updated x-rays and a cortisone injection at this time.  Primary osteoarthritis of both hands: He has PIP and DIP thickening consistent with osteoarthritis of both hands.  No tenderness or synovitis noted.  Primary osteoarthritis of left knee: Good range of motion of the left knee.  No warmth or effusion noted.  Other medical conditions are listed as follows:  Nontraumatic compression fracture of T12 vertebra, sequela  Neuropathy: Patient remains on gabapentin as prescribed.  Diverticulitis  History of foot fracture  Vitamin D deficiency  History of kidney stones   Orders: Orders Placed This Encounter  Procedures   COMPLETE METABOLIC PANEL WITH GFR   CBC with Differential/Platelet   QuantiFERON-TB Gold Plus   No orders of the defined types were placed in this encounter.   Follow-Up Instructions: Return in about 5 months (around 06/23/2023) for Ankylosing Spondylitis, Osteoarthritis.   Ofilia Neas, PA-C  Note - This record has been created using Dragon software.  Chart creation errors have been sought, but may not always  have been located. Such creation errors do not reflect on  the standard of medical care.

## 2023-01-11 ENCOUNTER — Other Ambulatory Visit (HOSPITAL_COMMUNITY): Payer: Self-pay

## 2023-01-23 ENCOUNTER — Other Ambulatory Visit: Payer: Self-pay | Admitting: *Deleted

## 2023-01-23 ENCOUNTER — Ambulatory Visit: Payer: Medicare Other | Attending: Physician Assistant | Admitting: Physician Assistant

## 2023-01-23 ENCOUNTER — Encounter: Payer: Self-pay | Admitting: Physician Assistant

## 2023-01-23 VITALS — BP 113/76 | HR 80 | Resp 16 | Ht 72.0 in | Wt 180.2 lb

## 2023-01-23 DIAGNOSIS — M450 Ankylosing spondylitis of multiple sites in spine: Secondary | ICD-10-CM

## 2023-01-23 DIAGNOSIS — E559 Vitamin D deficiency, unspecified: Secondary | ICD-10-CM | POA: Diagnosis present

## 2023-01-23 DIAGNOSIS — M546 Pain in thoracic spine: Secondary | ICD-10-CM

## 2023-01-23 DIAGNOSIS — G8929 Other chronic pain: Secondary | ICD-10-CM | POA: Diagnosis present

## 2023-01-23 DIAGNOSIS — M1712 Unilateral primary osteoarthritis, left knee: Secondary | ICD-10-CM | POA: Diagnosis present

## 2023-01-23 DIAGNOSIS — Z111 Encounter for screening for respiratory tuberculosis: Secondary | ICD-10-CM

## 2023-01-23 DIAGNOSIS — Z87442 Personal history of urinary calculi: Secondary | ICD-10-CM

## 2023-01-23 DIAGNOSIS — K5792 Diverticulitis of intestine, part unspecified, without perforation or abscess without bleeding: Secondary | ICD-10-CM | POA: Diagnosis present

## 2023-01-23 DIAGNOSIS — G629 Polyneuropathy, unspecified: Secondary | ICD-10-CM | POA: Diagnosis present

## 2023-01-23 DIAGNOSIS — M4854XS Collapsed vertebra, not elsewhere classified, thoracic region, sequela of fracture: Secondary | ICD-10-CM

## 2023-01-23 DIAGNOSIS — M25511 Pain in right shoulder: Secondary | ICD-10-CM | POA: Diagnosis present

## 2023-01-23 DIAGNOSIS — M5030 Other cervical disc degeneration, unspecified cervical region: Secondary | ICD-10-CM

## 2023-01-23 DIAGNOSIS — M19042 Primary osteoarthritis, left hand: Secondary | ICD-10-CM

## 2023-01-23 DIAGNOSIS — M19041 Primary osteoarthritis, right hand: Secondary | ICD-10-CM

## 2023-01-23 DIAGNOSIS — Z79899 Other long term (current) drug therapy: Secondary | ICD-10-CM | POA: Diagnosis not present

## 2023-01-23 DIAGNOSIS — Z8781 Personal history of (healed) traumatic fracture: Secondary | ICD-10-CM

## 2023-01-23 DIAGNOSIS — M542 Cervicalgia: Secondary | ICD-10-CM

## 2023-01-23 NOTE — Telephone Encounter (Signed)
Refill Enbrel pending labs.

## 2023-01-24 ENCOUNTER — Other Ambulatory Visit (HOSPITAL_COMMUNITY): Payer: Self-pay

## 2023-01-24 NOTE — Progress Notes (Signed)
CBC and CMP WNL

## 2023-01-25 LAB — CBC WITH DIFFERENTIAL/PLATELET
Absolute Monocytes: 488 cells/uL (ref 200–950)
Basophils Absolute: 59 cells/uL (ref 0–200)
Basophils Relative: 0.9 %
Eosinophils Absolute: 158 cells/uL (ref 15–500)
Eosinophils Relative: 2.4 %
HCT: 43.6 % (ref 38.5–50.0)
Hemoglobin: 15.1 g/dL (ref 13.2–17.1)
Lymphs Abs: 1822 cells/uL (ref 850–3900)
MCH: 33.3 pg — ABNORMAL HIGH (ref 27.0–33.0)
MCHC: 34.6 g/dL (ref 32.0–36.0)
MCV: 96 fL (ref 80.0–100.0)
MPV: 9.9 fL (ref 7.5–12.5)
Monocytes Relative: 7.4 %
Neutro Abs: 4072 cells/uL (ref 1500–7800)
Neutrophils Relative %: 61.7 %
Platelets: 286 10*3/uL (ref 140–400)
RBC: 4.54 10*6/uL (ref 4.20–5.80)
RDW: 11.6 % (ref 11.0–15.0)
Total Lymphocyte: 27.6 %
WBC: 6.6 10*3/uL (ref 3.8–10.8)

## 2023-01-25 LAB — QUANTIFERON-TB GOLD PLUS
Mitogen-NIL: >10 IU/mL
NIL: 0.04 IU/mL
QuantiFERON-TB Gold Plus: NEGATIVE
TB1-NIL: 0.01 IU/mL
TB2-NIL: 0.02 IU/mL

## 2023-01-25 LAB — COMPLETE METABOLIC PANEL WITH GFR
AG Ratio: 1.5 (calc) (ref 1.0–2.5)
ALT: 22 U/L (ref 9–46)
AST: 15 U/L (ref 10–35)
Albumin: 4.1 g/dL (ref 3.6–5.1)
Alkaline phosphatase (APISO): 79 U/L (ref 35–144)
BUN: 14 mg/dL (ref 7–25)
CO2: 29 mmol/L (ref 20–32)
Calcium: 9.2 mg/dL (ref 8.6–10.3)
Chloride: 104 mmol/L (ref 98–110)
Creat: 0.87 mg/dL (ref 0.70–1.30)
Globulin: 2.7 g/dL (calc) (ref 1.9–3.7)
Glucose, Bld: 94 mg/dL (ref 65–99)
Potassium: 4.4 mmol/L (ref 3.5–5.3)
Sodium: 139 mmol/L (ref 135–146)
Total Bilirubin: 0.5 mg/dL (ref 0.2–1.2)
Total Protein: 6.8 g/dL (ref 6.1–8.1)
eGFR: 104 mL/min/{1.73_m2} (ref >=60)

## 2023-01-25 NOTE — Progress Notes (Signed)
TB gold negative

## 2023-01-26 ENCOUNTER — Other Ambulatory Visit: Payer: Self-pay | Admitting: Rheumatology

## 2023-01-26 MED ORDER — ENBREL SURECLICK 50 MG/ML ~~LOC~~ SOAJ: 50.0000 mg | SUBCUTANEOUS | 0 refills | Status: DC

## 2023-01-26 NOTE — Telephone Encounter (Signed)
Next Visit: 07/05/2023  Last Visit: 01/23/2023  Last Fill: 08/23/2022  Dx: Ankylosing spondylitis of multiple sites in spine   Current Dose per office note on 01/23/2023: not discussed  Okay to refill Tizanidine?

## 2023-01-26 NOTE — Addendum Note (Signed)
Addended by: Carole Binning on: 01/26/2023 09:34 AM   Modules accepted: Orders

## 2023-01-26 NOTE — Telephone Encounter (Signed)
Next Visit: 07/05/2023  Last Visit: 01/23/2023  Last Fill: 11/17/2022 (30 day supply)  [id number] spondylitis of multiple sites in spine   Current Dose per office note [id number]: Enbrel SureClick 50 mg sq injections every 7 days.    Labs: 01/23/2023 CBC and CMP WNL   TB Gold: 01/23/2023 Neg    Okay to refill Enbrel?

## 2023-02-01 ENCOUNTER — Other Ambulatory Visit (HOSPITAL_COMMUNITY): Payer: Self-pay

## 2023-02-15 ENCOUNTER — Other Ambulatory Visit (HOSPITAL_COMMUNITY): Payer: Self-pay

## 2023-03-20 ENCOUNTER — Other Ambulatory Visit: Payer: Self-pay | Admitting: Rheumatology

## 2023-03-20 NOTE — Telephone Encounter (Signed)
Last Fill: 01/26/2023  Next Visit: 07/05/2023  Last Visit: 01/23/2023  Dx: Pain in thoracic spine   Current Dose per office note on 01/23/2023:   Okay to refill Tizanidine?

## 2023-04-17 ENCOUNTER — Other Ambulatory Visit: Payer: Self-pay | Admitting: Rheumatology

## 2023-04-17 NOTE — Telephone Encounter (Signed)
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   Okay to refill Enbrel?

## 2023-06-21 NOTE — Progress Notes (Deleted)
Office Visit Note  Patient: Benjamin Wiggins             Date of Birth: [birth year]           MRN: [id number]             PCP: Cheron Schaumann., MD Referring: Cheron Schaumann.,* Visit Date: 07/05/2023 Occupation: @GUAROCC @  Subjective:  No chief complaint on file.   History of Present Illness: Benjamin Wiggins is a 52 y.o. male ***     Activities of Daily Living:  Patient reports morning stiffness for *** {minute/hour:19697}.   Patient {ACTIONS;DENIES/REPORTS:21021675::"Denies"} nocturnal pain.  Difficulty dressing/grooming: {ACTIONS;DENIES/REPORTS:21021675::"Denies"} Difficulty climbing stairs: {ACTIONS;DENIES/REPORTS:21021675::"Denies"} Difficulty getting out of chair: {ACTIONS;DENIES/REPORTS:21021675::"Denies"} Difficulty using hands for taps, buttons, cutlery, and/or writing: {ACTIONS;DENIES/REPORTS:21021675::"Denies"}  No Rheumatology ROS completed.   PMFS History:  Patient Active Problem List   Diagnosis Date Noted   Metatarsal fracture 06/14/2021   DDD (degenerative disc disease), cervical 10/01/2018   Ankylosing spondylitis (HCC) 11/07/2016   High risk medication use 11/07/2016   Neuropathy 02/23/2016   Numbness 02/23/2016   Facial numbness 02/23/2016   Gait disorder 02/23/2016   Memory changes 02/23/2016    Past Medical History:  Diagnosis Date   Ankylosing spondylitis (HCC)    Kidney stones    per patient    Neuropathy     Family History  Problem Relation Age of Onset   Diabetes Father    Heart disease Father    Neuropathy Neg Hx    Past Surgical History:  Procedure Laterality Date   ANKLE SURGERY     titatium plate   Social History   Social History Narrative   Lives with [name]    Caffeine use: 3-4 cups per day   Immunization History  Administered Date(s) Administered   Influenza-Unspecified 08/22/2017, 09/17/2018   Pneumococcal Conjugate-13 09/17/2018   Pneumococcal Polysaccharide-23 08/22/2017      Objective: Vital Signs: There were no vitals taken for this visit.   Physical Exam   Musculoskeletal Exam: ***  CDAI Exam: CDAI Score: -- Patient Global: --; Provider Global: -- Swollen: --; Tender: -- Joint Exam 07/05/2023   No joint exam has been documented for this visit   There is currently no information documented on the homunculus. Go to the Rheumatology activity and complete the homunculus joint exam.  Investigation: No additional findings.  Imaging: No results found.  Recent Labs: Lab Results  Component Value Date   WBC 6.6 01/23/2023   HGB 15.1 01/23/2023   PLT 286 01/23/2023   NA 139 01/23/2023   K 4.4 01/23/2023   CL 104 01/23/2023   CO2 29 01/23/2023   GLUCOSE 94 01/23/2023   BUN 14 01/23/2023   CREATININE 0.87 01/23/2023   BILITOT 0.5 01/23/2023   ALKPHOS 96 07/18/2017   AST 15 01/23/2023   ALT 22 01/23/2023   PROT 6.8 01/23/2023   ALBUMIN 4.3 07/18/2017   CALCIUM 9.2 01/23/2023   GFRAA 116 11/03/2020   QFTBGOLDPLUS NEGATIVE 01/23/2023    Speciality Comments: No specialty comments available.  Procedures:  No procedures performed Allergies: Iohexol and Iodinated contrast media   Assessment / Plan:     Visit Diagnoses: No diagnosis found.  Orders: No orders of the defined types were placed in this encounter.  No orders of the defined types were placed in this encounter.   Face-to-face time spent with patient was *** minutes. Greater than 50% of time was spent in counseling and coordination of care.  Follow-Up Instructions: No follow-ups on file.   Ellen Henri, CMA  Note - This record has been created using Animal nutritionist.  Chart creation errors have been sought, but may not always  have been located. Such creation errors do not reflect on  the standard of medical care.

## 2023-07-05 ENCOUNTER — Ambulatory Visit: Payer: Medicare Other | Admitting: Rheumatology

## 2023-07-05 DIAGNOSIS — K5792 Diverticulitis of intestine, part unspecified, without perforation or abscess without bleeding: Secondary | ICD-10-CM

## 2023-07-05 DIAGNOSIS — G629 Polyneuropathy, unspecified: Secondary | ICD-10-CM

## 2023-07-05 DIAGNOSIS — Z79899 Other long term (current) drug therapy: Secondary | ICD-10-CM

## 2023-07-05 DIAGNOSIS — M546 Pain in thoracic spine: Secondary | ICD-10-CM

## 2023-07-05 DIAGNOSIS — M450 Ankylosing spondylitis of multiple sites in spine: Secondary | ICD-10-CM

## 2023-07-05 DIAGNOSIS — Z8781 Personal history of (healed) traumatic fracture: Secondary | ICD-10-CM

## 2023-07-05 DIAGNOSIS — M5030 Other cervical disc degeneration, unspecified cervical region: Secondary | ICD-10-CM

## 2023-07-05 DIAGNOSIS — M4854XS Collapsed vertebra, not elsewhere classified, thoracic region, sequela of fracture: Secondary | ICD-10-CM

## 2023-07-05 DIAGNOSIS — Z87442 Personal history of urinary calculi: Secondary | ICD-10-CM

## 2023-07-05 DIAGNOSIS — M19041 Primary osteoarthritis, right hand: Secondary | ICD-10-CM

## 2023-07-05 DIAGNOSIS — E559 Vitamin D deficiency, unspecified: Secondary | ICD-10-CM

## 2023-07-05 DIAGNOSIS — M1712 Unilateral primary osteoarthritis, left knee: Secondary | ICD-10-CM

## 2023-07-05 DIAGNOSIS — G8929 Other chronic pain: Secondary | ICD-10-CM

## 2023-08-22 ENCOUNTER — Other Ambulatory Visit: Payer: Self-pay | Admitting: Rheumatology

## 2023-10-11 ENCOUNTER — Other Ambulatory Visit: Payer: Self-pay | Admitting: Rheumatology

## 2023-11-07 ENCOUNTER — Other Ambulatory Visit: Payer: Self-pay | Admitting: Rheumatology

## 2024-02-06 ENCOUNTER — Other Ambulatory Visit: Payer: Self-pay | Admitting: Rheumatology

## 2024-07-14 ENCOUNTER — Other Ambulatory Visit: Payer: Self-pay | Admitting: Rheumatology

## 2024-12-29 ENCOUNTER — Other Ambulatory Visit: Payer: Self-pay

## 2024-12-31 ENCOUNTER — Other Ambulatory Visit: Payer: Self-pay
